# Patient Record
Sex: Male | Born: 1970 | Race: Black or African American | Hispanic: No | Marital: Married | Smoking: Former smoker
Health system: Southern US, Community
[De-identification: ages and names within clinical notes are randomized; demographics above are authoritative.]

## PROBLEM LIST (undated history)

## (undated) DIAGNOSIS — E669 Obesity, unspecified: Secondary | ICD-10-CM

## (undated) DIAGNOSIS — J40 Bronchitis, not specified as acute or chronic: Secondary | ICD-10-CM

## (undated) DIAGNOSIS — E785 Hyperlipidemia, unspecified: Secondary | ICD-10-CM

## (undated) DIAGNOSIS — R079 Chest pain, unspecified: Secondary | ICD-10-CM

## (undated) DIAGNOSIS — J189 Pneumonia, unspecified organism: Secondary | ICD-10-CM

## (undated) DIAGNOSIS — U071 COVID-19: Secondary | ICD-10-CM

## (undated) DIAGNOSIS — R0602 Shortness of breath: Secondary | ICD-10-CM

## (undated) DIAGNOSIS — J9690 Respiratory failure, unspecified, unspecified whether with hypoxia or hypercapnia: Secondary | ICD-10-CM

## (undated) HISTORY — DX: COVID-19: U07.1

## (undated) HISTORY — DX: Hyperlipidemia, unspecified: E78.5

## (undated) HISTORY — PX: SKIN GRAFT: SHX250

## (undated) HISTORY — DX: Chest pain, unspecified: R07.9

## (undated) HISTORY — PX: APPENDECTOMY: SHX54

## (undated) HISTORY — DX: Shortness of breath: R06.02

## (undated) HISTORY — DX: Pneumonia, unspecified organism: J18.9

## (undated) HISTORY — DX: Respiratory failure, unspecified, unspecified whether with hypoxia or hypercapnia: J96.90

## (undated) HISTORY — DX: Obesity, unspecified: E66.9

---

## 2005-05-17 ENCOUNTER — Emergency Department: Payer: Self-pay | Admitting: Emergency Medicine

## 2005-05-17 ENCOUNTER — Other Ambulatory Visit: Payer: Self-pay

## 2005-07-09 ENCOUNTER — Emergency Department: Payer: Self-pay | Admitting: Emergency Medicine

## 2005-09-27 ENCOUNTER — Emergency Department: Payer: Self-pay | Admitting: Unknown Physician Specialty

## 2006-09-29 ENCOUNTER — Emergency Department: Payer: Self-pay | Admitting: Emergency Medicine

## 2006-09-29 ENCOUNTER — Other Ambulatory Visit: Payer: Self-pay

## 2011-02-12 ENCOUNTER — Emergency Department: Payer: Self-pay | Admitting: Emergency Medicine

## 2011-06-06 ENCOUNTER — Emergency Department: Payer: Self-pay | Admitting: Emergency Medicine

## 2012-11-27 ENCOUNTER — Emergency Department: Payer: Self-pay | Admitting: Emergency Medicine

## 2012-11-27 LAB — COMPREHENSIVE METABOLIC PANEL
Albumin: 4.2 g/dL (ref 3.4–5.0)
Anion Gap: 2 — ABNORMAL LOW (ref 7–16)
BUN: 11 mg/dL (ref 7–18)
Bilirubin,Total: 0.6 mg/dL (ref 0.2–1.0)
Calcium, Total: 8.8 mg/dL (ref 8.5–10.1)
Chloride: 107 mmol/L (ref 98–107)
EGFR (Non-African Amer.): >60
Glucose: 114 mg/dL — ABNORMAL HIGH (ref 65–99)
SGPT (ALT): 51 U/L (ref 12–78)
Sodium: 139 mmol/L (ref 136–145)
Total Protein: 8 g/dL (ref 6.4–8.2)

## 2012-11-27 LAB — CBC
HCT: 47.4 % (ref 40.0–52.0)
HGB: 15.7 g/dL (ref 13.0–18.0)
MCH: 29.7 pg (ref 26.0–34.0)
MCV: 90 fL (ref 80–100)
Platelet: 212 10*3/uL (ref 150–440)
RBC: 5.29 10*6/uL (ref 4.40–5.90)
RDW: 14.1 % (ref 11.5–14.5)
WBC: 7.7 10*3/uL (ref 3.8–10.6)

## 2012-11-27 LAB — CK TOTAL AND CKMB (NOT AT ARMC): CK-MB: <0.5 ng/mL — ABNORMAL LOW (ref 0.5–3.6)

## 2012-11-27 LAB — TROPONIN I: Troponin-I: <0.02 ng/mL

## 2013-11-15 ENCOUNTER — Ambulatory Visit: Payer: Self-pay | Admitting: Family Medicine

## 2013-11-15 LAB — DOT URINE DIP
Blood: NEGATIVE
Glucose,UR: NEGATIVE mg/dL (ref 0–75)
Protein: NEGATIVE
Specific Gravity: 1.01 (ref 1.003–1.030)

## 2013-11-16 ENCOUNTER — Ambulatory Visit: Payer: Self-pay

## 2016-05-20 ENCOUNTER — Ambulatory Visit (INDEPENDENT_AMBULATORY_CARE_PROVIDER_SITE_OTHER): Payer: BLUE CROSS/BLUE SHIELD

## 2016-05-20 ENCOUNTER — Encounter (HOSPITAL_COMMUNITY): Payer: Self-pay | Admitting: *Deleted

## 2016-05-20 ENCOUNTER — Ambulatory Visit (HOSPITAL_COMMUNITY)
Admission: EM | Admit: 2016-05-20 | Discharge: 2016-05-20 | Disposition: A | Discharge status: Home / Self Care | Payer: BLUE CROSS/BLUE SHIELD | Attending: Family Medicine | Admitting: Family Medicine

## 2016-05-20 DIAGNOSIS — M722 Plantar fascial fibromatosis: Secondary | ICD-10-CM

## 2016-05-20 MED ORDER — IBUPROFEN 600 MG PO TABS: 600.0000 mg | ORAL_TABLET | Freq: Four times a day (QID) | ORAL | 0 refills | Status: DC | PRN

## 2016-05-20 NOTE — ED Triage Notes (Signed)
Pt  Reports  Ongoing  Pain l  Heel  For   Quite  A  While  Getting  Worse  Recently      Pt  denys  Any  specefic injury  But is  On his  Feet a  Lot

## 2016-05-20 NOTE — ED Provider Notes (Signed)
[facility]    CSN: [id number] Arrival date & time: 05/20/16  1717  First Provider Contact:  First MD Initiated Contact with Patient 05/20/16 1749    History   Chief Complaint Chief Complaint  Patient presents with  . Foot Pain   HPI Casey [name] is a 45 y.o. male presenting for left foot pain.   He reports several months of vague, mild, pain in the rear of his left foot worse with long days standing/walking at work. This has acutely worsened 3 days ago without injury or trauma to the area, at the end of the work day causing severe pain on weight bearing, no pain when non-weight bearing. Gold bond ointment provides some relief of the pain at night, no medications tried. It is mostly on the heel but radiates up his ankle to the knee. It occurs at night and with first step in the morning. He denies h/o surgery to the foot or ankle, but has tried inserts for shoes due to previous pain and reports worse pain with that. No numbness or tingling.   History reviewed. No pertinent past medical history.  There are no active problems to display for this patient.  History reviewed. No pertinent surgical history.  Home Medications    Prior to Admission medications   Medication Sig Start Date End Date Taking? Authorizing Provider  ibuprofen (ADVIL,MOTRIN) 600 MG tablet Take 1 tablet (600 mg total) by mouth every 6 (six) hours as needed. 05/20/16   Tyrone Nineyan B Jeannette Maddy, MD   Family History History reviewed. No pertinent family history.  Social History Social History  Substance Use Topics  . Smoking status: Never Smoker  . Smokeless tobacco: Not on file  . Alcohol use No   Allergies   Review of patient's allergies indicates no known allergies.  Review of Systems Review of Systems As above  Physical Exam Triage Vital Signs ED Triage Vitals  Enc Vitals Group     BP 05/20/16 1743 132/78     Pulse Rate 05/20/16 1743 80     Resp 05/20/16 1743 18     Temp 05/20/16 1743 98.6 F  (37 C)     Temp Source 05/20/16 1743 Oral     SpO2 05/20/16 1743 100 %     Weight --      Height --      Head Circumference --      Peak Flow --      Pain Score 05/20/16 1745 9     Pain Loc --      Pain Edu? --      Excl. in GC? --    No data found.   Updated Vital Signs BP 132/78 (BP Location: Right Arm)   Pulse 80   Temp 98.6 F (37 C) (Oral)   Resp 18   SpO2 100%    Physical Exam  Constitutional: He appears well-developed and well-nourished. No distress.  Musculoskeletal:  Left foot with pes planus, no other deformities, effusions, ecchymoses. Significant tenderness to palpation of medial and lateral calcaneus, + pain with squeezing calcaneus. Single heel raise ok. No haglund's deformity or tenderness of achilles' tendon. Sensation intact to light touch proximally and distally, Full ROM, DP 2+ bilaterally, cap refill <2 sec, no proximal tib-fib tenderness.  Neurological:  antalgic gait not bearing weight on left rear foot  Vitals reviewed.  UC Treatments / Results  Labs (all labs ordered are listed, but only abnormal results are displayed) Labs Reviewed - No data  to display  EKG  EKG Interpretation None      Radiology Dg Os Calcis Left  Result Date: 05/20/2016 CLINICAL DATA:  Acute onset of left plantar heel pain. Initial encounter. EXAM: LEFT OS CALCIS - 2+ VIEW COMPARISON:  None. FINDINGS: There is no evidence of fracture or dislocation. The calcaneus appears intact. Small plantar and posterior calcaneal spurs are seen. No definite soft tissue abnormalities are characterized on radiograph. No ankle joint effusion is seen. Visualized joint spaces are preserved. IMPRESSION: No evidence of fracture or dislocation. Small plantar and posterior calcaneal spurs noted. Electronically Signed   By: Roanna RaiderJeffery  Chang M.D.   On: 05/20/2016 18:44   Procedures Procedures (including critical care time)  Medications Ordered in UC Medications - No data to display  Initial  Impression / Assessment and Plan / UC Course  I have reviewed the triage vital signs and the nursing notes.  Pertinent labs & imaging results that were available during my care of the patient were reviewed by me and considered in my medical decision making (see chart for details).  Final Clinical Impressions(s) / UC Diagnoses   Final diagnoses:  Plantar fasciitis of left foot   45 y.o. male with probable severe plantar fasciitis vs. early calcaneal stress fracture. Initial XR's negative. Will advise RICE, offloading as able, and NSAIDs prn. He will follow up with foot & ankle tomorrow per preexisting appointment. Pt declines footwear/support at this time. Other symptomatic management discussed.   New Prescriptions New Prescriptions   IBUPROFEN (ADVIL,MOTRIN) 600 MG TABLET    Take 1 tablet (600 mg total) by mouth every 6 (six) hours as needed.     Tyrone Nineyan B Darenda Fike, MD 05/20/16 [phone]

## 2017-10-13 ENCOUNTER — Other Ambulatory Visit: Payer: Self-pay | Admitting: Internal Medicine

## 2017-10-13 ENCOUNTER — Ambulatory Visit
Admission: RE | Admit: 2017-10-13 | Discharge: 2017-10-13 | Disposition: A | Discharge status: Home / Self Care | Payer: BLUE CROSS/BLUE SHIELD | Source: Ambulatory Visit | Attending: Internal Medicine | Admitting: Internal Medicine

## 2017-10-13 DIAGNOSIS — M542 Cervicalgia: Secondary | ICD-10-CM

## 2017-10-13 DIAGNOSIS — M25512 Pain in left shoulder: Secondary | ICD-10-CM

## 2018-02-18 ENCOUNTER — Emergency Department (HOSPITAL_COMMUNITY)
Admission: EM | Admit: 2018-02-18 | Discharge: 2018-02-19 | Disposition: A | Discharge status: Home / Self Care | Payer: Self-pay | Attending: Emergency Medicine | Admitting: Emergency Medicine

## 2018-02-18 ENCOUNTER — Emergency Department (HOSPITAL_COMMUNITY): Payer: Self-pay

## 2018-02-18 ENCOUNTER — Encounter (HOSPITAL_COMMUNITY): Payer: Self-pay | Admitting: Emergency Medicine

## 2018-02-18 DIAGNOSIS — Z5321 Procedure and treatment not carried out due to patient leaving prior to being seen by health care provider: Secondary | ICD-10-CM | POA: Insufficient documentation

## 2018-02-18 DIAGNOSIS — R079 Chest pain, unspecified: Secondary | ICD-10-CM | POA: Insufficient documentation

## 2018-02-18 HISTORY — DX: Bronchitis, not specified as acute or chronic: J40

## 2018-02-18 LAB — CBC
HCT: 46.9 % (ref 39.0–52.0)
Hemoglobin: 15.5 g/dL (ref 13.0–17.0)
MCH: 29.9 pg (ref 26.0–34.0)
MCHC: 33 g/dL (ref 30.0–36.0)
MCV: 90.4 fL (ref 78.0–100.0)
PLATELETS: 223 10*3/uL (ref 150–400)
RBC: 5.19 MIL/uL (ref 4.22–5.81)
RDW: 12.5 % (ref 11.5–15.5)
WBC: 6.7 10*3/uL (ref 4.0–10.5)

## 2018-02-18 LAB — I-STAT TROPONIN, ED: TROPONIN I, POC: 0 ng/mL (ref 0.00–0.08)

## 2018-02-18 NOTE — ED Triage Notes (Signed)
Reports having cold like symptoms yesterday morning.  Then noticed chest pressure and sob earlier today.  No cardiac history.

## 2018-02-19 LAB — BASIC METABOLIC PANEL WITH GFR
Anion gap: 4 — ABNORMAL LOW (ref 5–15)
BUN: 7 mg/dL (ref 6–20)
CO2: 30 mmol/L (ref 22–32)
Calcium: 9.3 mg/dL (ref 8.9–10.3)
Chloride: 105 mmol/L (ref 101–111)
Creatinine, Ser: 1.29 mg/dL — ABNORMAL HIGH (ref 0.61–1.24)
GFR calc Af Amer: >60 mL/min
GFR calc non Af Amer: >60 mL/min
Glucose, Bld: 110 mg/dL — ABNORMAL HIGH (ref 65–99)
Potassium: 4 mmol/L (ref 3.5–5.1)
Sodium: 139 mmol/L (ref 135–145)

## 2020-05-26 ENCOUNTER — Emergency Department (HOSPITAL_COMMUNITY): Payer: Managed Care, Other (non HMO)

## 2020-05-26 ENCOUNTER — Emergency Department (HOSPITAL_COMMUNITY)
Admission: EM | Admit: 2020-05-26 | Discharge: 2020-05-26 | Disposition: A | Discharge status: Home / Self Care | Payer: Managed Care, Other (non HMO) | Attending: Emergency Medicine | Admitting: Emergency Medicine

## 2020-05-26 ENCOUNTER — Encounter (HOSPITAL_COMMUNITY): Payer: Self-pay | Admitting: Emergency Medicine

## 2020-05-26 ENCOUNTER — Other Ambulatory Visit: Payer: Self-pay

## 2020-05-26 DIAGNOSIS — U071 COVID-19: Secondary | ICD-10-CM | POA: Insufficient documentation

## 2020-05-26 DIAGNOSIS — R5383 Other fatigue: Secondary | ICD-10-CM | POA: Diagnosis not present

## 2020-05-26 DIAGNOSIS — J1282 Pneumonia due to coronavirus disease 2019: Secondary | ICD-10-CM | POA: Diagnosis not present

## 2020-05-26 DIAGNOSIS — R0789 Other chest pain: Secondary | ICD-10-CM | POA: Diagnosis present

## 2020-05-26 DIAGNOSIS — Z87891 Personal history of nicotine dependence: Secondary | ICD-10-CM | POA: Insufficient documentation

## 2020-05-26 DIAGNOSIS — R55 Syncope and collapse: Secondary | ICD-10-CM | POA: Diagnosis not present

## 2020-05-26 LAB — COMPREHENSIVE METABOLIC PANEL
ALT: 29 U/L (ref 0–44)
AST: 27 U/L (ref 15–41)
Albumin: 4 g/dL (ref 3.5–5.0)
Alkaline Phosphatase: 68 U/L (ref 38–126)
Anion gap: 9 (ref 5–15)
BUN: 12 mg/dL (ref 6–20)
CO2: 24 mmol/L (ref 22–32)
Calcium: 8.7 mg/dL — ABNORMAL LOW (ref 8.9–10.3)
Chloride: 99 mmol/L (ref 98–111)
Creatinine, Ser: 1.01 mg/dL (ref 0.61–1.24)
GFR calc Af Amer: >60 mL/min (ref >60)
GFR calc non Af Amer: >60 mL/min (ref >60)
Glucose, Bld: 129 mg/dL — ABNORMAL HIGH (ref 70–99)
Potassium: 4.1 mmol/L (ref 3.5–5.1)
Sodium: 132 mmol/L — ABNORMAL LOW (ref 135–145)
Total Bilirubin: 0.9 mg/dL (ref 0.3–1.2)
Total Protein: 7.7 g/dL (ref 6.5–8.1)

## 2020-05-26 LAB — CBC WITH DIFFERENTIAL/PLATELET
Abs Immature Granulocytes: 0.02 10*3/uL (ref 0.00–0.07)
Basophils Absolute: 0 10*3/uL (ref 0.0–0.1)
Basophils Relative: 0 %
Eosinophils Absolute: 0 10*3/uL (ref 0.0–0.5)
Eosinophils Relative: 0 %
HCT: 45.4 % (ref 39.0–52.0)
Hemoglobin: 16 g/dL (ref 13.0–17.0)
Immature Granulocytes: 0 %
Lymphocytes Relative: 13 %
Lymphs Abs: 1 10*3/uL (ref 0.7–4.0)
MCH: 31.2 pg (ref 26.0–34.0)
MCHC: 35.2 g/dL (ref 30.0–36.0)
MCV: 88.5 fL (ref 80.0–100.0)
Monocytes Absolute: 0.8 10*3/uL (ref 0.1–1.0)
Monocytes Relative: 10 %
Neutro Abs: 6 10*3/uL (ref 1.7–7.7)
Neutrophils Relative %: 77 %
Platelets: 190 10*3/uL (ref 150–400)
RBC: 5.13 MIL/uL (ref 4.22–5.81)
RDW: 12.3 % (ref 11.5–15.5)
WBC: 7.8 10*3/uL (ref 4.0–10.5)
nRBC: 0 % (ref 0.0–0.2)

## 2020-05-26 LAB — URINALYSIS, ROUTINE W REFLEX MICROSCOPIC
Bilirubin Urine: NEGATIVE
Glucose, UA: NEGATIVE mg/dL
Hgb urine dipstick: NEGATIVE
Ketones, ur: 5 mg/dL — AB
Leukocytes,Ua: NEGATIVE
Nitrite: NEGATIVE
Protein, ur: NEGATIVE mg/dL
Specific Gravity, Urine: 1.013 (ref 1.005–1.030)
pH: 6 (ref 5.0–8.0)

## 2020-05-26 LAB — TROPONIN I (HIGH SENSITIVITY)
Troponin I (High Sensitivity): 5 ng/L (ref <18)
Troponin I (High Sensitivity): 5 ng/L (ref <18)

## 2020-05-26 LAB — SARS CORONAVIRUS 2 BY RT PCR (HOSPITAL ORDER, PERFORMED IN ~~LOC~~ HOSPITAL LAB): SARS Coronavirus 2: POSITIVE — AB

## 2020-05-26 MED ORDER — SODIUM CHLORIDE 0.9 % IV SOLN: Freq: Once | INTRAVENOUS | Status: AC

## 2020-05-26 MED ORDER — DIPHENHYDRAMINE HCL 50 MG/ML IJ SOLN: 50.0000 mg | Freq: Once | INTRAMUSCULAR | Status: DC | PRN

## 2020-05-26 MED ORDER — ONDANSETRON 4 MG PO TBDP: ORAL_TABLET | ORAL | 0 refills | Status: DC

## 2020-05-26 MED ORDER — ONDANSETRON HCL 4 MG/2ML IJ SOLN
4.0000 mg | Freq: Once | INTRAMUSCULAR | Status: AC
  Administered 2020-05-26: 4 mg via INTRAVENOUS
  Filled 2020-05-26: qty 2

## 2020-05-26 MED ORDER — ALBUTEROL SULFATE HFA 108 (90 BASE) MCG/ACT IN AERS: 2.0000 | INHALATION_SPRAY | Freq: Once | RESPIRATORY_TRACT | Status: DC | PRN

## 2020-05-26 MED ORDER — SODIUM CHLORIDE 0.9 % IV SOLN
1200.0000 mg | Freq: Once | INTRAVENOUS | Status: AC
  Administered 2020-05-26: 1200 mg via INTRAVENOUS
  Filled 2020-05-26: qty 1200

## 2020-05-26 MED ORDER — SODIUM CHLORIDE 0.9 % IV SOLN: INTRAVENOUS | Status: DC | PRN

## 2020-05-26 MED ORDER — EPINEPHRINE 0.3 MG/0.3ML IJ SOAJ: 0.3000 mg | Freq: Once | INTRAMUSCULAR | Status: DC | PRN

## 2020-05-26 MED ORDER — METHYLPREDNISOLONE SODIUM SUCC 125 MG IJ SOLR: 125.0000 mg | Freq: Once | INTRAMUSCULAR | Status: DC | PRN

## 2020-05-26 MED ORDER — FAMOTIDINE IN NACL 20-0.9 MG/50ML-% IV SOLN: 20.0000 mg | Freq: Once | INTRAVENOUS | Status: DC | PRN

## 2020-05-26 MED ORDER — SODIUM CHLORIDE 0.9 % IV BOLUS
1000.0000 mL | Freq: Once | INTRAVENOUS | Status: AC
  Administered 2020-05-26: 1000 mL via INTRAVENOUS

## 2020-05-26 NOTE — Discharge Instructions (Signed)
You have tested positive for COVID-19 virus.  Please continue to quarantine at home and monitor your symptoms closely. You chest x-ray shows mild Covid pneumonia but her oxygen saturations remained normal when walking.  Antibiotics are not helpful in treating viral infection, the virus should run its course in about 14  days. Please make sure you are drinking plenty of fluids and eating regular meals you were dehydrated today and this is likely why you had been feeling lightheaded and having episodes where you passed out.  You can treat your symptoms supportively with tylenol and Motrin for fevers and pains, and over the counter cough syrups and throat lozenges to help with cough. Zofran as needed for nausea.  If your symptoms are not improving please follow up with you Primary doctor.   You were given Covid monoclonal antibody infusion today to hopefully reduce the chance of you having severe symptoms.  I recommend that you purchase a home pulse ox to help better monitor your oxygen at home, if you start to have increased work of breathing or shortness of breath or your oxygen drops below 90% please immediately return to the hospital for reevaluation.  If you develop persistent fevers, shortness of breath or difficulty breathing, chest pain, severe headache and neck pain, persistent nausea and vomiting or other new or concerning symptoms return to the Emergency department.

## 2020-05-26 NOTE — ED Provider Notes (Signed)
[facility]-EMERGENCY DEPT Provider Note   CSN: [id number] Arrival date & time: 05/26/20  [id number]     History Chief Complaint  Patient presents with  . Chest Pain    covid positive    Casey [name] is a 49 y.o. male.  Casey [name] is a 49 y.o. male with history of bronchitis, who tested positive for Covid on Friday after having a week of symptoms.  Patient symptoms started with a cough, despite symptoms he traveled by plane to [place] while in [place] he lost his sense of smell and found out that his daughter had tested positive for Covid.  He returned home on a plane and continued to have worsening symptoms.  He states persistent cough, and Monday and Tuesday started having some persistent chest pain and shortness of breath.  He reports fatigue, body aches, generalized weakness and poor appetite.  No vomiting or diarrhea, has had some mild nausea.  Denies any associated abdominal pain.  He states that yesterday morning when getting up he had a syncopal episode, he is not sure if he fell to the floor, EMS was called out to check him out but patient refused transport.  Overnight he was standing up from the bed and had a second syncopal episode where he fell backwards onto the bed, so his wife called EMS and insisted he go for evaluation.  He states that he has been monitoring his symptoms at home, has not taken any medications today for treatment and has not had anything to eat or drink.  He states that Thursday night he did have one episode when he checked his pulse ox at home and it was 87%.  He has not noted any low readings since then that he knows of.  Patient did not receive his Covid vaccine.        Past Medical History:  Diagnosis Date  . Bronchitis     There are no problems to display for this patient.   Past Surgical History:  Procedure Laterality Date  . APPENDECTOMY    . SKIN GRAFT         History reviewed. No pertinent family history.  Social  History   Tobacco Use  . Smoking status: Former Games developermoker  . Smokeless tobacco: Never Used  Substance Use Topics  . Alcohol use: No  . Drug use: Never    Home Medications Prior to Admission medications   Medication Sig Start Date End Date Taking? Authorizing Provider  azithromycin (ZITHROMAX) 250 MG tablet Take 250 mg by mouth taper from 4 doses each day to 1 dose and stop.   Yes [provider]  predniSONE (DELTASONE) 5 MG tablet Take 5 mg by mouth See admin instructions. Taper dose   Yes [provider]  zinc gluconate 50 MG tablet Take 50 mg by mouth daily.   Yes [provider]  ibuprofen (ADVIL,MOTRIN) 600 MG tablet Take 1 tablet (600 mg total) by mouth every 6 (six) hours as needed. 05/20/16   Tyrone NineGrunz, Ryan B, MD  ondansetron (ZOFRAN ODT) 4 MG disintegrating tablet 4mg  ODT q4 hours prn nausea/vomit 05/26/20   Dartha LodgeFord, Ludie Pavlik N, PA-C    Allergies    Patient has no known allergies.  Review of Systems   Review of Systems  Constitutional: Positive for chills and fatigue. Negative for fever.  HENT: Positive for congestion and rhinorrhea. Negative for sore throat.   Respiratory: Positive for cough and shortness of breath.   Cardiovascular: Positive for  chest pain.  Gastrointestinal: Positive for nausea. Negative for abdominal pain, diarrhea and vomiting.  Genitourinary: Negative for dysuria and frequency.  Musculoskeletal: Positive for myalgias. Negative for arthralgias.  Skin: Negative for color change and rash.  Neurological: Positive for syncope, weakness (Generalized) and light-headedness. Negative for dizziness, numbness and headaches.    Physical Exam Updated Vital Signs BP (!) 147/91   Pulse 84   Temp 99.9 F (37.7 C) (Oral)   Resp 16   SpO2 96%   Physical Exam Vitals and nursing note reviewed.  Constitutional:      General: He is not in acute distress.    Appearance: He is well-developed. He is not diaphoretic.  HENT:     Head: Normocephalic  and atraumatic.  Eyes:     General:        Right eye: No discharge.        Left eye: No discharge.     Pupils: Pupils are equal, round, and reactive to light.  Cardiovascular:     Rate and Rhythm: Normal rate and regular rhythm.     Pulses:          Radial pulses are 2+ on the right side and 2+ on the left side.       Dorsalis pedis pulses are 2+ on the right side and 2+ on the left side.     Heart sounds: Normal heart sounds. No murmur heard.  No friction rub. No gallop.   Pulmonary:     Effort: Pulmonary effort is normal. No respiratory distress.     Breath sounds: Normal breath sounds. No wheezing or rales.     Comments: Respirations equal and unlabored, patient able to speak in full sentences, lungs clear to auscultation bilaterally Chest:     Chest wall: No tenderness.  Abdominal:     General: Bowel sounds are normal. There is no distension.     Palpations: Abdomen is soft. There is no mass.     Tenderness: There is no abdominal tenderness. There is no guarding.     Comments: Abdomen soft, nondistended, nontender to palpation in all quadrants without guarding or peritoneal signs  Musculoskeletal:        General: No deformity.     Cervical back: Neck supple.     Right lower leg: No edema.     Left lower leg: No edema.  Skin:    General: Skin is warm and dry.     Capillary Refill: Capillary refill takes less than 2 seconds.  Neurological:     Mental Status: He is alert.     Coordination: Coordination normal.     Comments: Speech is clear, able to follow commands Moves extremities without ataxia, coordination intact  Psychiatric:        Mood and Affect: Mood normal.        Behavior: Behavior normal.     ED Results / Procedures / Treatments   Labs (all labs ordered are listed, but only abnormal results are displayed) Labs Reviewed  SARS CORONAVIRUS 2 BY RT PCR (HOSPITAL ORDER, PERFORMED IN [facility]) - Abnormal; Notable for the following components:       Result Value   SARS Coronavirus 2 POSITIVE (*)    All other components within normal limits  COMPREHENSIVE METABOLIC PANEL - Abnormal; Notable for the following components:   Sodium 132 (*)    Glucose, Bld 129 (*)    Calcium 8.7 (*)    All other components within normal  limits  URINALYSIS, ROUTINE W REFLEX MICROSCOPIC - Abnormal; Notable for the following components:   Ketones, ur 5 (*)    All other components within normal limits  CBC WITH DIFFERENTIAL/PLATELET  TROPONIN I (HIGH SENSITIVITY)  TROPONIN I (HIGH SENSITIVITY)    EKG EKG Interpretation  Date/Time:  Sunday May 26 2020 06:46:13 EDT Ventricular Rate:  85 PR Interval:    QRS Duration: 82 QT Interval:  338 QTC Calculation: 402 R Axis:   73 Text Interpretation: Sinus rhythm Left atrial enlargement ST elevation suggests acute pericarditis Confirmed by Kohut, Stephen ([id number]) on 05/26/2020 7:29:55 AM   Radiology DG Chest Portable 1 View  Result Date: 05/26/2020 CLINICAL DATA:  COVID. EXAM: PORTABLE CHEST 1 VIEW COMPARISON:  02/18/2018 FINDINGS: Mild patchy pulmonary opacity best seen at the left base. No Kerley lines, effusion, or pneumothorax. Normal heart size. IMPRESSION: Mild bilateral pneumonia. Electronically Signed   By: Jonathon  Watts M.D.   On: 05/26/2020 07:33    Procedures Procedures (including critical care time)  Medications Ordered in ED Medications  0.9 %  sodium chloride infusion (has no administration in time range)  diphenhydrAMINE (BENADRYL) injection 50 mg (has no administration in time range)  famotidine (PEPCID) IVPB 20 mg premix (has no administration in time range)  methylPREDNISolone sodium succinate (SOLU-MEDROL) 125 mg/2 mL injection 125 mg (has no administration in time range)  albuterol (VENTOLIN HFA) 108 (90 Base) MCG/ACT inhaler 2 puff (has no administration in time range)  EPINEPHrine (EPI-PEN) injection 0.3 mg (has no administration in time range)  sodium chloride 0.9 % bolus  1,000 mL (0 mLs Intravenous Stopped 05/26/20 1042)  ondansetron (ZOFRAN) injection 4 mg (4 mg Intravenous Given 05/26/20 0837)  casirivimab-imdevimab (REGEN-COV) 1,200 mg in sodium chloride 0.9 % 110 mL IVPB (0 mg Intravenous Stopped 05/26/20 1154)  0.9 %  sodium chloride infusion ( Intravenous Stopped 05/26/20 1338)    ED Course  I have reviewed the triage vital signs and the nursing notes.  Pertinent labs & imaging results that were available during my care of the patient were reviewed by me and considered in my medical decision making (see chart for details).    MDM Rules/Calculators/A&P                           49  year old male presents after being diagnosed with Covid on 8/20 after having a week of symptoms including cough, fatigue, chest pain and shortness of breath.  He has had very poor appetite and nausea and has not had much to eat or drink over the past several days, had a syncopal episode yesterday morning, EMS checked him out but he refused transport, had another syncopal episode during the night and his wife encouraged him to come in for evaluation.  Patient does not think that he had head injury during syncope and has no evidence of head trauma on exam.  Normal neurologic exam.  He currently denies any chest pain, reports shortness of breath with activity.  Patient ambulated in the emergency department and did not have hypoxia, tachycardia or significant increased work of breathing.  After receiving IV fluids here in the emergency department you no longer feels lightheaded or near syncopal symptoms.  His lab evaluation has overall been reassuring with no leukocytosis, no significant electrolyte derangements, normal renal and liver function.  Negative troponins x2, EKG without arrhythmia or concerning ischemic changes.  I have low suspicion for PE as aside from current Covid infection he  has no other risk factors, and is not tachycardic or hypoxic at rest or with ambulation.  Chest x-ray  consistent with Covid pneumonia.  Given patient's obesity and history of previous smoking he is a candidate for monoclonal antibody infusion, after discussing this with patient he is in agreement and would like to receive this here in the ED today, discussed potential risks and side effects of this medication and patient provides consent.  Patient was provided FDA handout with patient information on this new medication.  Patient received monoclonal antibody infusion and was monitored in the emergency department for 1 hour afterwards with no adverse reaction.  Patient is feeling much better after IV fluids and he has been able to ambulate stably in the emergency department with no further syncopal symptoms.  I have discussed continued symptomatic treatment at home and strict return precautions with the patient encouraged him to purchase home pulse ox to help monitor her oxygen levels and worsening symptoms at home.  Prescribe Zofran for nausea and encouraged to eat and drink meals regularly and to make sure he stays hydrated to prevent further syncopal episodes.   At this time there does not appear to be any evidence of an acute emergency medical condition and the patient appears stable for discharge with appropriate outpatient follow up.Diagnosis was discussed with patient who verbalizes understanding and is agreeable to discharge.   Final Clinical Impression(s) / ED Diagnoses Final diagnoses:  Pneumonia due to COVID-19 virus  Syncope, unspecified syncope type  Fatigue, unspecified type    Rx / DC Orders ED Discharge Orders         Ordered    ondansetron (ZOFRAN ODT) 4 MG disintegrating tablet        05/26/20 1327           Legrand Rams 05/28/20 2236    Raeford Razor, MD 05/29/20 [phone]

## 2020-05-26 NOTE — ED Triage Notes (Signed)
Patient brought in by [facility] from home. Patient tested positive for covid. Saturday had a syncope episode and ems checked him out. Patient refused transport. Patient had a feeling that he was going to pass out but did not. Patient is complaining of chest pain, cough, and back pain.

## 2020-05-26 NOTE — ED Notes (Signed)
CRITICAL VALUE ALERT  Critical Value:  COVID Positive   Date & Time Notied:  05/26/2020, 9417  Provider Notified: Ala Dach PA   Orders Received/Actions taken: PA aware

## 2020-06-11 ENCOUNTER — Ambulatory Visit (INDEPENDENT_AMBULATORY_CARE_PROVIDER_SITE_OTHER): Payer: Managed Care, Other (non HMO) | Admitting: Nurse Practitioner

## 2020-06-11 ENCOUNTER — Encounter: Payer: Self-pay | Admitting: Nurse Practitioner

## 2020-06-11 ENCOUNTER — Telehealth: Payer: Self-pay | Admitting: Nurse Practitioner

## 2020-06-11 ENCOUNTER — Other Ambulatory Visit: Payer: Self-pay

## 2020-06-11 ENCOUNTER — Ambulatory Visit
Admission: RE | Admit: 2020-06-11 | Discharge: 2020-06-11 | Disposition: A | Discharge status: Home / Self Care | Payer: Managed Care, Other (non HMO) | Source: Ambulatory Visit | Attending: Nurse Practitioner | Admitting: Nurse Practitioner

## 2020-06-11 VITALS — BP 122/74 | HR 73 | Temp 97.7°F | Ht 71.0 in | Wt 208.0 lb

## 2020-06-11 DIAGNOSIS — J1282 Pneumonia due to coronavirus disease 2019: Secondary | ICD-10-CM

## 2020-06-11 DIAGNOSIS — R079 Chest pain, unspecified: Secondary | ICD-10-CM | POA: Insufficient documentation

## 2020-06-11 DIAGNOSIS — R9431 Abnormal electrocardiogram [ECG] [EKG]: Secondary | ICD-10-CM | POA: Diagnosis not present

## 2020-06-11 DIAGNOSIS — R0602 Shortness of breath: Secondary | ICD-10-CM | POA: Insufficient documentation

## 2020-06-11 DIAGNOSIS — U071 COVID-19: Secondary | ICD-10-CM | POA: Insufficient documentation

## 2020-06-11 DIAGNOSIS — R39198 Other difficulties with micturition: Secondary | ICD-10-CM

## 2020-06-11 DIAGNOSIS — Z8616 Personal history of COVID-19: Secondary | ICD-10-CM | POA: Insufficient documentation

## 2020-06-11 MED ORDER — ALBUTEROL SULFATE HFA 108 (90 BASE) MCG/ACT IN AERS: 2.0000 | INHALATION_SPRAY | Freq: Four times a day (QID) | RESPIRATORY_TRACT | 0 refills | Status: DC | PRN

## 2020-06-11 MED ORDER — BENZONATATE 100 MG PO CAPS: 100.0000 mg | ORAL_CAPSULE | Freq: Two times a day (BID) | ORAL | 0 refills | Status: DC | PRN

## 2020-06-11 NOTE — Assessment & Plan Note (Signed)
Shortness of breath:  Will check repeat chest x ray   Stay active - increase activity as tolerated  Stay well hydrated  Deep breathing exercises  May start vitamin C 2,000 mg daily, vitamin D3 2,000 IU daily, Zinc 220 mg daily, and Quercetin 500 mg twice daily  May take tylenol or fever or pain  May take mucinex DM twice daily    Difficulty urinating:  Will place referral to urology - this was happening before Covid   Hand out given for PCP - please call to make an appointment with a PCP - keep BP log for PCP appt   Abnormal EKG:  EKG in office shows NSR with possible acute pericarditis - will place referral to cardiology   Follow up:  Follow up in 2 weeks or sooner if needed

## 2020-06-11 NOTE — Patient Instructions (Addendum)
Covid pneumonia Shortness of breath:  Will check repeat chest x ray   Stay active - increase activity as tolerated  Stay well hydrated  Deep breathing exercises  May start vitamin C 2,000 mg daily, vitamin D3 2,000 IU daily, Zinc 220 mg daily, and Quercetin 500 mg twice daily  May take tylenol or fever or pain  May take mucinex DM twice daily    Difficulty urinating:  Will place referral to urology - this was happening before Covid   Hand out given for PCP - please call to make an appointment with a PCP - keep BP log for PCP appt   Abnormal EKG:  EKG in office shows NSR with possible acute pericarditis - will place referral to cardiology   Follow up:  Follow up in 2 weeks or sooner if needed

## 2020-06-11 NOTE — Progress Notes (Signed)
@Patient  ID: , male    DOB: [birth year], 49 y.o.   MRN: [id number]  Chief Complaint  Patient presents with  . Covid Positive    Tested positive 8/22; Sx: Fatigue, cough    Referring provider: 9/22, MD  49 year old male with no significant health history. Diagnosed with Covid August 2021.   HPI  Patient presents today for post COVID care clinic visit/ED follow-up.  Patient was seen in the ED on 05/26/2020 and was diagnosed with Covid pneumonia.  Patient was treated with monoclonal antibody infusion while in the ED.  Patient is EKG in the emergency room showed sinus rhythm with left atrial enlargement and ST elevation suggesting acute pericarditis.  Negative troponins x2.  Patient states that he has slightly improved since ED discharge.  He still complains of shortness of breath on exertion especially when climbing stairs.  Patient still complains of ongoing cough.  He states that he has been using his incentive spirometer at home.  He does still complain of fatigue.  Patient states that he does not currently have a PCP.  We will give him a handout today on local [facility] PCPs here excepting new patients.  Patient does also complain of difficulty urinating and is requesting a referral to urology.  He states that this was an issue before he had Covid.  Vital signs in office today are stable.  Repeat EKG does still show possible pericarditis.  Cardiology appointment made for this Thursday and patient was made aware of his appointment time before leaving the office today. Denies f/c/s, n/v/d, hemoptysis, PND, chest pain or edema.     No Known Allergies   There is no immunization history on file for this patient.  Past Medical History:  Diagnosis Date  . Bronchitis     Tobacco History: Social History   Tobacco Use  Smoking Status Former Smoker  Smokeless Tobacco Never Used   Counseling given: Yes   Outpatient Encounter Medications as of 06/11/2020  Medication Sig   . ibuprofen (ADVIL,MOTRIN) 600 MG tablet Take 1 tablet (600 mg total) by mouth every 6 (six) hours as needed.  . zinc gluconate 50 MG tablet Take 50 mg by mouth daily.  08/11/2020 albuterol (VENTOLIN HFA) 108 (90 Base) MCG/ACT inhaler Inhale 2 puffs into the lungs every 6 (six) hours as needed for wheezing or shortness of breath.  . benzonatate (TESSALON) 100 MG capsule Take 1 capsule (100 mg total) by mouth 2 (two) times daily as needed for cough.  . ondansetron (ZOFRAN ODT) 4 MG disintegrating tablet 4mg  ODT q4 hours prn nausea/vomit (Patient not taking: Reported on 06/11/2020)  . [DISCONTINUED] azithromycin (ZITHROMAX) 250 MG tablet Take 250 mg by mouth taper from 4 doses each day to 1 dose and stop.  . [DISCONTINUED] predniSONE (DELTASONE) 5 MG tablet Take 5 mg by mouth See admin instructions. Taper dose   No facility-administered encounter medications on file as of 06/11/2020.     Review of Systems  Review of Systems  Constitutional: Positive for fatigue. Negative for chills and fever.  Respiratory: Positive for cough and shortness of breath.   Cardiovascular: Negative for chest pain, palpitations and leg swelling.       Physical Exam  BP 122/74 (BP Location: Right Arm)   Pulse 73   Temp 97.7 F (36.5 C)   Ht 5\' 11"  (1.803 m)   Wt 208 lb (94.3 kg)   SpO2 97%   BMI 29.01 kg/m   Wt Readings  from Last 5 Encounters:  06/11/20 208 lb (94.3 kg)  05/26/20 205 lb (93 kg)  02/18/18 215 lb (97.5 kg)     Physical Exam Vitals and nursing note reviewed.  Constitutional:      General: He is not in acute distress.    Appearance: He is well-developed.  Cardiovascular:     Rate and Rhythm: Normal rate and regular rhythm.  Pulmonary:     Effort: Pulmonary effort is normal.     Breath sounds: Normal breath sounds.  Musculoskeletal:     Right lower leg: No edema.     Left lower leg: No edema.  Skin:    General: Skin is warm and dry.  Neurological:     Mental Status: He is alert and  oriented to person, place, and time.       Imaging: DG Chest Portable 1 View  Result Date: 05/26/2020 CLINICAL DATA:  COVID. EXAM: PORTABLE CHEST 1 VIEW COMPARISON:  02/18/2018 FINDINGS: Mild patchy pulmonary opacity best seen at the left base. No Kerley lines, effusion, or pneumothorax. Normal heart size. IMPRESSION: Mild bilateral pneumonia. Electronically Signed   By: Marnee Spring M.D.   On: 05/26/2020 07:33     Assessment & Plan:   History of COVID-19 Shortness of breath:  Will check repeat chest x ray   Stay active - increase activity as tolerated  Stay well hydrated  Deep breathing exercises  May start vitamin C 2,000 mg daily, vitamin D3 2,000 IU daily, Zinc 220 mg daily, and Quercetin 500 mg twice daily  May take tylenol or fever or pain  May take mucinex DM twice daily    Difficulty urinating:  Will place referral to urology - this was happening before Covid   Hand out given for PCP - please call to make an appointment with a PCP - keep BP log for PCP appt   Abnormal EKG:  EKG in office shows NSR with possible acute pericarditis - will place referral to cardiology   Follow up:  Follow up in 2 weeks or sooner if needed     Ivonne Andrew, NP 06/11/2020

## 2020-06-11 NOTE — Telephone Encounter (Signed)
Results given to patient. Verbally understood. No additional questions °

## 2020-06-11 NOTE — Telephone Encounter (Signed)
-----   Message from Ivonne Andrew, NP sent at 06/11/2020 11:44 AM EDT ----- Please call to let patient know that his x ray is back and did show improvement in inspiratory effort, but the pneumonia is still there. Sometimes it does take a few weeks for this to clear. Make sure to do breathing exercises and exercises that I gave him in the printout. He will need a 2-3 week follow up and will need a repeat chest x ray at that time. Thanks.

## 2020-06-13 ENCOUNTER — Encounter: Payer: Self-pay | Admitting: *Deleted

## 2020-06-13 ENCOUNTER — Ambulatory Visit (INDEPENDENT_AMBULATORY_CARE_PROVIDER_SITE_OTHER): Payer: Managed Care, Other (non HMO) | Admitting: Cardiology

## 2020-06-13 ENCOUNTER — Encounter: Payer: Self-pay | Admitting: Cardiology

## 2020-06-13 VITALS — BP 100/76 | HR 93 | Ht 71.0 in | Wt 209.8 lb

## 2020-06-13 DIAGNOSIS — R9431 Abnormal electrocardiogram [ECG] [EKG]: Secondary | ICD-10-CM | POA: Diagnosis not present

## 2020-06-13 DIAGNOSIS — Z8616 Personal history of COVID-19: Secondary | ICD-10-CM

## 2020-06-13 DIAGNOSIS — R0602 Shortness of breath: Secondary | ICD-10-CM | POA: Diagnosis not present

## 2020-06-13 NOTE — Progress Notes (Signed)
Cardiology Office Note:    Date:  06/13/2020   ID:  Casey [name], DOB [birth year], MRN [id number]  PCP:  Georgann Housekeeper, MD  [facility] Cardiologist:  Donato Schultz, MD  [facility] Electrophysiologist:  None   Referring MD: Ivonne Andrew, NP    History of Present Illness:    Casey [name] is a 49 y.o. male here for the evaluation of shortness of breath and abnormal EKG at the request of Angus Seller, NP.  Recent Covid pneumonia.  Was in the emergency department on 05/26/2020.  Diagnosed with Covid.  Treated with monoclonal antibody infusion while in the emergency department.  EKG showed sinus rhythm with left atrial enlargement and ST elevation suggestive of acute pericarditis.  His troponins were normal x2.  Shortness of breath with climbing stairs.  Cough.  He is here today to evaluate overall cardiac function.  Repeat EKG during an office visit on 06/11/2020 personally reviewed and interpreted shows diffuse J-point elevation especially in leads V4, V5, V6.  No real change from prior EKG on 05/26/2020.  In addition, I have personally reviewed his prior EKG from 2019-it is very similar with J-point elevation as well.  Same findings from 2007 as well.  When coughing had some discomfort. Eating [name]. 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  Had to postpone this because of Covid.  Just got appetite back. Lost weight. Not as winded. Still not feeling 100%. Used to walk over 2 miles a day.  Was struggling to get to the mailbox and back.  Slowly getting better.  Syncope episode when COVID   Used to have high BP, no longer.   Father CABG at later age.   No smoker.   Past Medical History:  Diagnosis Date  . Bronchitis   . Chest pain on exertion   . COVID-19   . Hyperlipidemia   . Obesity   . Pneumonia   . Respiratory failure (HCC)   . SOB (shortness of breath)     Past Surgical History:   Procedure Laterality Date  . APPENDECTOMY    . SKIN GRAFT      Current Medications: Current Meds  Medication Sig  . albuterol (VENTOLIN HFA) 108 (90 Base) MCG/ACT inhaler Inhale 2 puffs into the lungs every 6 (six) hours as needed for wheezing or shortness of breath.  . benzonatate (TESSALON) 100 MG capsule Take 1 capsule (100 mg total) by mouth 2 (two) times daily as needed for cough.  Marland Kitchen ibuprofen (ADVIL,MOTRIN) 600 MG tablet Take 1 tablet (600 mg total) by mouth every 6 (six) hours as needed.  . ondansetron (ZOFRAN ODT) 4 MG disintegrating tablet 4mg  ODT q4 hours prn nausea/vomit  . zinc gluconate 50 MG tablet Take 50 mg by mouth daily.     Allergies:   Other and Zegerid [omeprazole]   Social History   Socioeconomic History  . Marital status: Married    Spouse name: Not on file  . Number of children: Not on file  . Years of education: Not on file  . Highest education level: Not on file  Occupational History  . Not on file  Tobacco Use  . Smoking status: Former  . Smokeless tobacco: Never Used  Substance and Sexual Activity  . Alcohol use: No  . Drug use: Never  . Sexual activity: Not on file  Other Topics Concern  . Not on file  Social History  Narrative  . Not on file   Social Determinants of Health   Financial Resource Strain:   . Difficulty of Paying Living Expenses: Not on file  Food Insecurity:   . Worried About Programme researcher, broadcasting/film/video in the Last Year: Not on file  . Ran Out of Food in the Last Year: Not on file  Transportation Needs:   . Lack of Transportation (Medical): Not on file  . Lack of Transportation (Non-Medical): Not on file  Physical Activity:   . Days of Exercise per Week: Not on file  . Minutes of Exercise per Session: Not on file  Stress:   . Feeling of Stress : Not on file  Social Connections:   . Frequency of Communication with Friends and Family: Not on file  . Frequency of Social Gatherings with Friends and Family: Not on file  .  Attends Religious Services: Not on file  . Active Member of Clubs or Organizations: Not on file  . Attends Banker Meetings: Not on file  . Marital Status: Not on file     Family History: Father had CABG coronary disease.   ROS:   Please see the history of present illness.     All other systems reviewed and are negative.  EKGs/Labs/Other Studies Reviewed:    The following studies were reviewed today: Chest x-ray personally reviewed from both 8/22 and 06/11/2020-patchy infiltrates, Covid pneumonia.   Recent Labs: 05/26/2020: ALT 29; BUN 12; Creatinine, Ser 1.01; Hemoglobin 16.0; Platelets 190; Potassium 4.1; Sodium 132  Recent Lipid Panel No results found for: CHOL, TRIG, HDL, CHOLHDL, VLDL, LDLCALC, LDLDIRECT  Physical Exam:    VS:  BP 100/76   Pulse 93   Ht 5\' 11"  (1.803 m)   Wt 209 lb 12.8 oz (95.2 kg)   SpO2 96%   BMI 29.26 kg/m     Wt Readings from Last 3 Encounters:  06/13/20 209 lb 12.8 oz (95.2 kg)  06/11/20 208 lb (94.3 kg)  05/26/20 205 lb (93 kg)     GEN:  Well nourished, well developed in no acute distress HEENT: Normal NECK: No JVD; No carotid bruits LYMPHATICS: No lymphadenopathy CARDIAC: RRR, no murmurs, rubs, gallops RESPIRATORY:  Clear to auscultation without rales, wheezing or rhonchi  ABDOMEN: Soft, non-tender, non-distended MUSCULOSKELETAL:  No edema; No deformity  SKIN: Warm and dry NEUROLOGIC:  Alert and oriented x 3 PSYCHIATRIC:  Normal affect   ASSESSMENT:    1. Shortness of breath   2. History of COVID-19   3. Nonspecific abnormal electrocardiogram (ECG) (EKG)    PLAN:    In order of problems listed above:  Abnormal EKG -We will check an echocardiogram to ensure proper structure and function of his heart.  His EKG findings of J-point elevation have been present from prior EKGs even over 10 to 15 years ago.  This is not indicative of acute pericarditis in the setting.  Hopefully, there is no suggestion of post COVID  cardiomyopathy.  Echocardiogram will tell 05/28/20 this.  Dyspnea on exertion -Post Covid.  Still coughing.  Likely residual effects of Covid pneumonia.  Chest x-rays personally reviewed and interpreted demonstrate patchy infiltrates.  Prior ER notes reviewed. -Hopefully over time, this will improve.  He was quite sick with Covid, lost his appetite for 2 weeks.  Lost weight.  Checking echocardiogram.  Continuing with incentive spirometer.  We will follow-up with results of echocardiogram.   Medication Adjustments/Labs and Tests Ordered: Current medicines are reviewed at length with  the patient today.  Concerns regarding medicines are outlined above.  Orders Placed This Encounter  Procedures  . ECHOCARDIOGRAM COMPLETE   No orders of the defined types were placed in this encounter.   Patient Instructions  Medication Instructions:  The current medical regimen is effective;  continue present plan and medications.  *If you need a refill on your cardiac medications before your next appointment, please call your pharmacy*  Testing/Procedures: Your physician has requested that you have an echocardiogram. Echocardiography is a painless test that uses sound waves to create images of your heart. It provides your doctor with information about the size and shape of your heart and how well your heart's chambers and valves are working. This procedure takes approximately one hour. There are no restrictions for this procedure.  Follow-Up: Follow up will be determined after the above testing has been completed.  We recommend signing up for the patient portal called "MyChart".  Sign up information is provided on this After Visit Summary.  MyChart is used to connect with patients for Virtual Visits (Telemedicine).  Patients are able to view lab/test results, encounter notes, upcoming appointments, etc.  Non-urgent messages can be sent to your provider as well.   To learn more about what you can do with MyChart,  go to [email or web address].     Thank you for choosing [facility]!!        Signed, Donato Schultz, MD  06/13/2020 3:20 PM    [facility]

## 2020-06-13 NOTE — Patient Instructions (Signed)
Medication Instructions:  The current medical regimen is effective;  continue present plan and medications.  *If you need a refill on your cardiac medications before your next appointment, please call your pharmacy*  Testing/Procedures: Your physician has requested that you have an echocardiogram. Echocardiography is a painless test that uses sound waves to create images of your heart. It provides your doctor with information about the size and shape of your heart and how well your hearts chambers and valves are working. This procedure takes approximately one hour. There are no restrictions for this procedure.  Follow-Up: Follow up will be determined after the above testing has been completed.  We recommend signing up for the patient portal called "MyChart".  Sign up information is provided on this After Visit Summary.  MyChart is used to connect with patients for Virtual Visits (Telemedicine).  Patients are able to view lab/test results, encounter notes, upcoming appointments, etc.  Non-urgent messages can be sent to your provider as well.   To learn more about what you can do with MyChart, go to [email or web address].     Thank you for choosing [facility]!!

## 2020-06-14 ENCOUNTER — Encounter: Payer: Self-pay | Admitting: Nurse Practitioner

## 2020-06-27 ENCOUNTER — Other Ambulatory Visit: Payer: Self-pay

## 2020-06-27 ENCOUNTER — Ambulatory Visit (INDEPENDENT_AMBULATORY_CARE_PROVIDER_SITE_OTHER): Payer: Managed Care, Other (non HMO) | Admitting: Nurse Practitioner

## 2020-06-27 ENCOUNTER — Ambulatory Visit
Admission: RE | Admit: 2020-06-27 | Discharge: 2020-06-27 | Disposition: A | Discharge status: Home / Self Care | Payer: Managed Care, Other (non HMO) | Source: Ambulatory Visit | Attending: Nurse Practitioner | Admitting: Nurse Practitioner

## 2020-06-27 VITALS — BP 118/78 | HR 71 | Temp 97.5°F | Ht 71.0 in | Wt 209.0 lb

## 2020-06-27 DIAGNOSIS — R5381 Other malaise: Secondary | ICD-10-CM | POA: Diagnosis not present

## 2020-06-27 DIAGNOSIS — Z8616 Personal history of COVID-19: Secondary | ICD-10-CM | POA: Diagnosis not present

## 2020-06-27 DIAGNOSIS — G933 Postviral fatigue syndrome: Secondary | ICD-10-CM | POA: Diagnosis not present

## 2020-06-27 DIAGNOSIS — G9331 Postviral fatigue syndrome: Secondary | ICD-10-CM

## 2020-06-27 NOTE — Assessment & Plan Note (Signed)
Covid pnuemonia Shortness of breath Physical deconditioing:  Will check repeat chest x ray   Stay active - increase activity as tolerated  Stay well hydrated  Deep breathing exercises  May start vitamin C 2,000 mg daily, vitamin D3 2,000 IU daily, Zinc 220 mg daily, and Quercetin 500 mg twice daily  May take tylenol or fever or pain  May take mucinex DM twice daily    Difficulty urinating:  Please keep appointment with urology - this was happening before Covid    Abnormal EKG:  Please keep follow up with cardiology   Follow up:  Follow up in 1 month - may need repeat chest xray

## 2020-06-27 NOTE — Assessment & Plan Note (Deleted)
Shortness of breath:  Will check repeat chest x ray   Stay active - increase activity as tolerated  Stay well hydrated  Deep breathing exercises  May start vitamin C 2,000 mg daily, vitamin D3 2,000 IU daily, Zinc 220 mg daily, and Quercetin 500 mg twice daily  May take tylenol or fever or pain  May take mucinex DM twice daily    Difficulty urinating:  Will place referral to urology - this was happening before Covid   Hand out given for PCP - please call to make an appointment with a PCP - keep BP log for PCP appt   Abnormal EKG:  EKG in office shows NSR with possible acute pericarditis - will place referral to cardiology   Follow up:  Follow up in 2 weeks or sooner if needed 

## 2020-06-27 NOTE — Patient Instructions (Signed)
History of Covid Covid pnuemonia Shortness of breath Physical deconditioing:  Will check repeat chest x ray   Stay active - increase activity as tolerated  Stay well hydrated  Deep breathing exercises  May start vitamin C 2,000 mg daily, vitamin D3 2,000 IU daily, Zinc 220 mg daily, and Quercetin 500 mg twice daily  May take tylenol or fever or pain  May take mucinex DM twice daily    Difficulty urinating:  Please keep appointment with urology - this was happening before Covid    Abnormal EKG:  Please keep follow up with cardiology   Follow up:  Follow up in 1 month - may need repeat chest xray

## 2020-06-27 NOTE — Progress Notes (Signed)
@Patient  ID: , male    DOB: 03-24-1971, 49 y.o.   MRN: [id number]  Chief Complaint  Patient presents with  . Follow-up    2 week follow up, still taking musinex, still having SOB and tightness. Has already seen Cardiology schdeuled for 9/27 for ECHO    Referring provider: 10/27, MD  49 year old male with no significant health history. Diagnosed with Covid August 2021.    HPI  Patient presents for post COVID care clinic visit follow-up.  Since patient's last visit here he has seen cardiology and has an echo scheduled for Monday.  He was referred to urology for difficulty urinating which was happening before Covid and had does have an appointment scheduled with them in a couple weeks.  Patient complains of continued fatigue and deconditioning.  He states that he struggles performing activities of daily living.  He becomes fatigued very easily.  Patient's last chest x-ray showed improved airflow but persistent pneumonia.  Denies f/c/s, n/v/d, hemoptysis, PND, chest pain or edema.       Allergies  Allergen Reactions  . Other     KAPIDEX STOMACH PAIN  . Zegerid [Omeprazole]     BURNING IN THE CHEST     There is no immunization history on file for this patient.  Past Medical History:  Diagnosis Date  . Bronchitis   . Chest pain on exertion   . COVID-19   . Hyperlipidemia   . Obesity   . Pneumonia   . Respiratory failure (HCC)   . SOB (shortness of breath)     Tobacco History: Social History   Tobacco Use  Smoking Status Former Smoker  Smokeless Tobacco Never Used   Counseling given: Not Answered   Outpatient Encounter Medications as of 06/27/2020  Medication Sig  . albuterol (VENTOLIN HFA) 108 (90 Base) MCG/ACT inhaler Inhale 2 puffs into the lungs every 6 (six) hours as needed for wheezing or shortness of breath.  . zinc gluconate 50 MG tablet Take 50 mg by mouth daily.  . benzonatate (TESSALON) 100 MG capsule Take 1 capsule (100 mg total)  by mouth 2 (two) times daily as needed for cough. (Patient not taking: Reported on 06/27/2020)  . ibuprofen (ADVIL,MOTRIN) 600 MG tablet Take 1 tablet (600 mg total) by mouth every 6 (six) hours as needed. (Patient not taking: Reported on 06/27/2020)  . ondansetron (ZOFRAN ODT) 4 MG disintegrating tablet 4mg  ODT q4 hours prn nausea/vomit (Patient not taking: Reported on 06/27/2020)   No facility-administered encounter medications on file as of 06/27/2020.     Review of Systems  Review of Systems  Constitutional: Positive for fatigue. Negative for fever.  HENT: Negative.   Respiratory: Positive for cough and shortness of breath.   Cardiovascular: Negative.   Gastrointestinal: Negative.   Allergic/Immunologic: Negative.   Neurological: Negative.   Psychiatric/Behavioral: Negative.        Physical Exam  BP 118/78 (BP Location: Right Arm)   Pulse 71   Temp (!) 97.5 F (36.4 C)   Ht 5\' 11"  (1.803 m)   Wt 209 lb 0.1 oz (94.8 kg)   SpO2 98%   BMI 29.15 kg/m   Wt Readings from Last 5 Encounters:  06/27/20 209 lb 0.1 oz (94.8 kg)  06/13/20 209 lb 12.8 oz (95.2 kg)  06/11/20 208 lb (94.3 kg)  05/26/20 205 lb (93 kg)  02/18/18 215 lb (97.5 kg)     Physical Exam Vitals and nursing note reviewed.  Constitutional:  General: He is not in acute distress.    Appearance: He is well-developed.  Cardiovascular:     Rate and Rhythm: Normal rate and regular rhythm.  Pulmonary:     Effort: Pulmonary effort is normal.     Breath sounds: Normal breath sounds.  Musculoskeletal:     Right lower leg: No edema.     Left lower leg: No edema.  Skin:    General: Skin is warm and dry.  Neurological:     Mental Status: He is alert and oriented to person, place, and time.  Psychiatric:        Mood and Affect: Mood normal.        Behavior: Behavior normal.     Imaging: DG Chest 2 View  Result Date: 06/11/2020 CLINICAL DATA:  History of COVID, history of smoking . EXAM: CHEST - 2 VIEW  COMPARISON:  Chest x-ray 05/26/2020, chest x-ray 02/18/2018 FINDINGS: The heart size and mediastinal contours are within normal limits. Improved inspiratory effort. Persistent mild patchy peripheral airspace opacities within the mid to lower lung zones, left greater than right. Bilateral lower lobe round pericentimeter densities likely represent nipple shadows. The visualized skeletal structures are unremarkable. IMPRESSION: Persistent mild bilateral pneumonia. Electronically Signed   By: Tish Frederickson M.D.   On: 06/11/2020 09:46     Assessment & Plan:   History of COVID-19 Covid pnuemonia Shortness of breath Physical deconditioing:  Will check repeat chest x ray   Stay active - increase activity as tolerated  Stay well hydrated  Deep breathing exercises  May start vitamin C 2,000 mg daily, vitamin D3 2,000 IU daily, Zinc 220 mg daily, and Quercetin 500 mg twice daily  May take tylenol or fever or pain  May take mucinex DM twice daily    Difficulty urinating:  Please keep appointment with urology - this was happening before Covid    Abnormal EKG:  Please keep follow up with cardiology   Follow up:  Follow up in 1 month - may need repeat chest xray      Ivonne Andrew, NP 06/27/2020

## 2020-07-01 ENCOUNTER — Ambulatory Visit (HOSPITAL_COMMUNITY): Payer: Managed Care, Other (non HMO) | Attending: Internal Medicine

## 2020-07-01 ENCOUNTER — Other Ambulatory Visit: Payer: Self-pay

## 2020-07-01 DIAGNOSIS — R0602 Shortness of breath: Secondary | ICD-10-CM | POA: Insufficient documentation

## 2020-07-01 LAB — ECHOCARDIOGRAM COMPLETE
Area-P 1/2: 3.65 cm2
S' Lateral: 3.2 cm

## 2020-07-03 ENCOUNTER — Encounter: Payer: Self-pay | Admitting: Nurse Practitioner

## 2020-07-18 ENCOUNTER — Other Ambulatory Visit: Payer: Self-pay

## 2020-07-18 ENCOUNTER — Ambulatory Visit: Payer: Managed Care, Other (non HMO) | Attending: Nurse Practitioner

## 2020-07-18 DIAGNOSIS — M6281 Muscle weakness (generalized): Secondary | ICD-10-CM | POA: Diagnosis present

## 2020-07-18 DIAGNOSIS — R262 Difficulty in walking, not elsewhere classified: Secondary | ICD-10-CM | POA: Diagnosis present

## 2020-07-18 DIAGNOSIS — R5382 Chronic fatigue, unspecified: Secondary | ICD-10-CM | POA: Insufficient documentation

## 2020-07-18 DIAGNOSIS — U099 Post covid-19 condition, unspecified: Secondary | ICD-10-CM | POA: Insufficient documentation

## 2020-07-18 DIAGNOSIS — G9332 Myalgic encephalomyelitis/chronic fatigue syndrome: Secondary | ICD-10-CM

## 2020-07-18 DIAGNOSIS — Z7409 Other reduced mobility: Secondary | ICD-10-CM | POA: Diagnosis present

## 2020-07-19 NOTE — Therapy (Signed)
[facility] [address] [place], [place], [address] Phone: [phone]   Fax:  [phone]  Physical Therapy Evaluation  Patient Details  Name: Casey [name] MRN: [id number] Date of Birth: 1971-03-15 Referring Provider (PT): Ivonne Andrew, NP   Encounter Date: 07/18/2020   PT End of Session - 07/18/20 1817    Visit Number 1    Number of Visits 9    Date for PT Re-Evaluation 09/14/20    Authorization Type [employer] MA[employer]GED    PT Start Time 1500    PT Stop Time 1600    PT Time Calculation (min) 60 min    Activity Tolerance Patient tolerated treatment well    Behavior During Therapy [facility] for tasks assessed/performed           Past Medical History:  Diagnosis Date  . Bronchitis   . Chest pain on exertion   . COVID-19   . Hyperlipidemia   . Obesity   . Pneumonia   . Respiratory failure (HCC)   . SOB (shortness of breath)     Past Surgical History:  Procedure Laterality Date  . APPENDECTOMY    . SKIN GRAFT      Vitals:      Subjective Assessment - 07/18/20 1744    Subjective Pt reports being Dx c covid-19 on 05/24/20. Since then he reports he is better, but still has a long way to go. Pt is to return to work at [employer] 10/18 and is not sure how he is going to do with the fatigue and SOB issues he is experiencing.    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 No/denies              [facility] PT Assessment - 07/19/20 0001      Assessment   Medical Diagnosis History of COVID-19; Physical deconditioning; Postviral fatigue syndrome    Referring Provider (PT) Ivonne Andrew, NP    Onset Date/Surgical Date 05/24/20    Hand Dominance Right    Prior Therapy No      Precautions   Precautions None      Restrictions   Weight Bearing Restrictions No      Balance Screen   Has the patient  fallen in the past 6 months No    Has the patient had a decrease in activity level because of a fear of falling?  No    Is the patient reluctant to leave their home because of a fear of falling?  No      Home Environment   Living Environment Private residence    Living Arrangements Spouse/significant other    Type of Home House    Home Access Stairs to enter    Entrance Stairs-Number of Steps 3    Entrance Stairs-Rails Left;Right    Home Layout Two level    Alternate Level Stairs-Number of Steps 20    Alternate Level Stairs-Rails Right;Left      Prior Function   Vocation Full time employment    Vocation Requirements [employer]      Cognition   Overall Cognitive Status Within Functional Limits for tasks assessed      Observation/Other Assessments   Focus on Therapeutic Outcomes (FOTO)  52% functional ability      Sensation   Light Touch Appears Intact  Posture/Postural Control   Posture/Postural Control No significant limitations      AROM   Overall AROM Comments AROMs for UEs and LEs WNLs      Strength   Overall Strength Comments UEs and LEs 5/5 strength except for bilat shoulder flex and abd      Ambulation/Gait   Gait Pattern Step-through pattern;Within Functional Limits      6 Minute Walk- Baseline   6 Minute Walk- Baseline yes    HR (bpm) 86    02 Sat (%RA) 98 %    Perceived Rate of Exertion (Borg) 6-   none     6 Minute walk- Post Test   6 Minute Walk Post Test yes    HR (bpm) 96    02 Sat (%RA) 94 %    Perceived Rate of Exertion (Borg) 11- Fairly light      6 minute walk test results    Aerobic Endurance Distance Walked 1015    Endurance additional comments .47 yds/sec                      Objective measurements completed on examination: See above findings.               PT Education - 07/18/20 1813    Education Details Eval findings, POC, walking program of 1-2x/day for 1 mile, managing distance and pace as tolerated,  not exceeding a Somewhat Hard RPE. Pt is to progress walking 15 mins every 3-4 dyas as tolerated.    Person(s) Educated Patient    Methods Explanation    Comprehension Verbalized understanding;Returned demonstration;Verbal cues required;Tactile cues required            PT Short Term Goals - 07/19/20 0656      PT SHORT TERM GOAL #1   Title Pt will be Ind in an initial HEP walking progream    Status New    Target Date 08/09/20      PT SHORT TERM GOAL #2   Title Review the results of FOTO results    Baseline FOTO administered    Status New    Target Date 08/02/20             PT Long Term Goals - 07/19/20 0657      PT LONG TERM GOAL #1   Title Pt will be Ind in an advanced HEP to continue the benefit of PT    Status New    Target Date 09/27/20      PT LONG TERM GOAL #2   Title Re-administer FOTO on 6th visit    Status New    Target Date 08/16/20      PT LONG TERM GOAL #3   Title With , pt's distance will increase to 2508 ft for a walking pace of 1.1 yd/sec for improve tolerance to activity at home and work    Baseline 1876 ft, .47 yds/sec    Status New    Target Date 09/27/20      PT LONG TERM GOAL #4   Title Pt will report toleraance to work activity to " limited a little bit "    Baseline "Limited a lot"    Status New    Target Date 09/27/20      PT LONG TERM GOAL #5   Title Pt's FOTO score will improve to 72% functioal ability    Baseline 52% functional ability    Status New    Target Date 09/27/20  Plan - 07/18/20 1819    Clinical Impression Statement Pt presents with decreased tolerance to activity and fatigue s/p covid-19 in August. Pt's distance was significantly limited with a walking speed of .47 yds/sec. Pt reported a Fairly light RPE. Pt will benefit from PT to improve strength and activity tolerance to improve his function with daily activities and to help him return to work at highest possible level.    Personal  Factors and Comorbidities Comorbidity 2    Comorbidities covid-19, bronchitis, obese    Examination-Activity Limitations Carry;Lift;Stairs;Squat;Locomotion Level;Reach Overhead;Transfers    Examination-Participation Restrictions Occupation    Stability/Clinical Decision Making Evolving/Moderate complexity    Clinical Decision Making Moderate    Rehab Potential Good    PT Frequency 1x / week    PT Duration 8 weeks    PT Treatment/Interventions ADLs/Self Care Home Management;Gait training;Stair training;Functional mobility training;Therapeutic activities;Therapeutic exercise;Balance training;Manual techniques;Patient/family education;Energy conservation;Taping    PT Next Visit Plan Assess response to walking program    PT Home Exercise Plan Walking progream- see education    Recommended Other Services Note written to employer to limit work requirements based on pt's tolerance    Consulted and Agree with Plan of Care Patient           Patient will benefit from skilled therapeutic intervention in order to improve the following deficits and impairments:  Cardiopulmonary status limiting activity, Decreased activity tolerance, Decreased strength, Obesity, Decreased endurance, Difficulty walking  Visit Diagnosis: Post-COVID chronic fatigue  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 Chest pain 06/11/2020  . Shortness of breath 06/11/2020  . History of COVID-19 06/11/2020  . Nonspecific abnormal electrocardiogram (ECG) (EKG) 06/11/2020  . Difficulty urinating 06/11/2020  . Pneumonia due to COVID-19 virus 06/11/2020    Joellyn Rued MS, PT 07/19/20 7:26 AM  [facility] [address] [place], [place], [address] Phone: [phone]   Fax:   [phone]  Name: BROC CASPERS MRN: [id number] Date of Birth: 01-Mar-1971

## 2020-07-24 ENCOUNTER — Ambulatory Visit: Payer: Managed Care, Other (non HMO) | Admitting: Urology

## 2020-07-25 ENCOUNTER — Ambulatory Visit (INDEPENDENT_AMBULATORY_CARE_PROVIDER_SITE_OTHER): Payer: Managed Care, Other (non HMO) | Admitting: Nurse Practitioner

## 2020-07-25 ENCOUNTER — Other Ambulatory Visit: Payer: Self-pay

## 2020-07-25 VITALS — BP 138/88 | HR 80 | Temp 97.8°F | Ht 71.0 in | Wt 215.0 lb

## 2020-07-25 DIAGNOSIS — R39198 Other difficulties with micturition: Secondary | ICD-10-CM

## 2020-07-25 DIAGNOSIS — Z8616 Personal history of COVID-19: Secondary | ICD-10-CM

## 2020-07-25 DIAGNOSIS — R0602 Shortness of breath: Secondary | ICD-10-CM | POA: Diagnosis not present

## 2020-07-25 MED ORDER — ALBUTEROL SULFATE HFA 108 (90 BASE) MCG/ACT IN AERS: 2.0000 | INHALATION_SPRAY | Freq: Four times a day (QID) | RESPIRATORY_TRACT | 0 refills | Status: AC | PRN

## 2020-07-25 NOTE — Assessment & Plan Note (Signed)
Shortness of breath Physical deconditioing:  Recent chest x ray was clear  Stay active - increase activity as tolerated  Stay well hydrated  Deep breathing exercises  May start vitamin C 2,000 mg daily, vitamin D3 2,000 IU daily, Zinc 220 mg daily, and Quercetin 500 mg twice daily  May take tylenol or fever or pain  May take mucinex DM twice daily    Difficulty urinating:  Please keep appointment with urology - this was happening before Covid    Abnormal EKG:  Please keep follow up with cardiology   Follow up:  Follow up as needed

## 2020-07-25 NOTE — Patient Instructions (Signed)
History of Covid Shortness of breath Physical deconditioing:  Recent chest x ray was clear  Stay active - increase activity as tolerated  Stay well hydrated  Deep breathing exercises  May start vitamin C 2,000 mg daily, vitamin D3 2,000 IU daily, Zinc 220 mg daily, and Quercetin 500 mg twice daily  May take tylenol or fever or pain  May take mucinex DM twice daily    Difficulty urinating:  Please keep appointment with urology - this was happening before Covid    Abnormal EKG:  Please keep follow up with cardiology   Follow up:  Follow up as needed

## 2020-07-25 NOTE — Progress Notes (Signed)
@Patient  ID: , male    DOB: [birth year], 49 y.o.   MRN: [id number]  Chief Complaint  Patient presents with  . Follow-up    No questions or concerns    Referring provider: [id number], MD   49 year old male with no significant health history. Diagnosed with Covid August 2021.  HPI  Patient presents today for post COVID care clinic visit follow-up.  Patient states that he has much improved since last visit.  Patient works 2 jobs he has started back to one of his jobs.  Patient states that he does use his albuterol inhaler as needed and needs a refill on this today.  Patient's chest x-ray at last visit was clear.  Patient has been evaluated by physical therapy and has started an exercise program with them.  Patient did follow-up with urology for difficulty urinating.  Patient also followed up with cardiology for abnormal EKG from the emergency room.  Cardiology ordered an echo and patient does have a follow-up appointment scheduled with them to discuss results.  Overall patient states that he is doing well. Denies f/c/s, n/v/d, hemoptysis, PND, chest pain or edema.       Allergies  Allergen Reactions  . Other     KAPIDEX STOMACH PAIN  . Zegerid [Omeprazole]     BURNING IN THE CHEST     There is no immunization history on file for this patient.  Past Medical History:  Diagnosis Date  . Bronchitis   . Chest pain on exertion   . COVID-19   . Hyperlipidemia   . Obesity   . Pneumonia   . Respiratory failure (HCC)   . SOB (shortness of breath)     Tobacco History: Social History   Tobacco Use  Smoking Status Former Smoker  Smokeless Tobacco Never Used   Counseling given: Not Answered   Outpatient Encounter Medications as of 07/25/2020  Medication Sig  . albuterol (VENTOLIN HFA) 108 (90 Base) MCG/ACT inhaler Inhale 2 puffs into the lungs every 6 (six) hours as needed for wheezing or shortness of breath.  . zinc gluconate 50 MG tablet Take 50 mg by  mouth daily.  . [DISCONTINUED] albuterol (VENTOLIN HFA) 108 (90 Base) MCG/ACT inhaler Inhale 2 puffs into the lungs every 6 (six) hours as needed for wheezing or shortness of breath.  . benzonatate (TESSALON) 100 MG capsule Take 1 capsule (100 mg total) by mouth 2 (two) times daily as needed for cough. (Patient not taking: Reported on 06/27/2020)  . ibuprofen (ADVIL,MOTRIN) 600 MG tablet Take 1 tablet (600 mg total) by mouth every 6 (six) hours as needed. (Patient not taking: Reported on 06/27/2020)  . ondansetron (ZOFRAN ODT) 4 MG disintegrating tablet 4mg  ODT q4 hours prn nausea/vomit (Patient not taking: Reported on 06/27/2020)   No facility-administered encounter medications on file as of 07/25/2020.     Review of Systems  Review of Systems  Constitutional: Negative.  Negative for fever.  HENT: Negative.   Respiratory: Negative for cough and shortness of breath.   Cardiovascular: Negative.  Negative for chest pain, palpitations and leg swelling.  Gastrointestinal: Negative.   Allergic/Immunologic: Negative.   Neurological: Negative.   Psychiatric/Behavioral: Negative.        Physical Exam  BP 138/88   Pulse 80   Temp 97.8 F (36.6 C)   Ht 5\' 11"  (1.803 m)   Wt 215 lb 0.1 oz (97.5 kg)   SpO2 98%   BMI 29.99 kg/m   Wt  Readings from Last 5 Encounters:  07/25/20 215 lb 0.1 oz (97.5 kg)  06/27/20 209 lb 0.1 oz (94.8 kg)  06/13/20 209 lb 12.8 oz (95.2 kg)  06/11/20 208 lb (94.3 kg)  05/26/20 205 lb (93 kg)     Physical Exam Vitals and nursing note reviewed.  Constitutional:      General: He is not in acute distress.    Appearance: He is well-developed.  Cardiovascular:     Rate and Rhythm: Normal rate and regular rhythm.  Pulmonary:     Effort: Pulmonary effort is normal.     Breath sounds: Normal breath sounds.  Musculoskeletal:     Right lower leg: No edema.     Left lower leg: No edema.  Skin:    General: Skin is warm and dry.  Neurological:     Mental  Status: He is alert and oriented to person, place, and time.  Psychiatric:        Mood and Affect: Mood normal.        Behavior: Behavior normal.      Imaging: DG Chest 2 View  Result Date: 06/28/2020 CLINICAL DATA:  Cough and shortness of breath. Reported COVID-19 positive EXAM: CHEST - 2 VIEW COMPARISON:  June 11, 2020 FINDINGS: Lungs are clear. Heart size and pulmonary vascularity are normal. No adenopathy. No bone lesions. IMPRESSION: Lungs clear.  Cardiac silhouette normal. Electronically Signed   By: Bretta Bang III M.D.   On: 06/28/2020 08:42   ECHOCARDIOGRAM COMPLETE  Result Date: 07/01/2020    ECHOCARDIOGRAM REPORT   Patient Name:   Casey [name] Date of Exam: 07/01/2020 Medical Rec #:  [id number]      Height:       71.0 in Accession #:    [id number]     Weight:       209.0 lb Date of Birth:  January 30, 1971      BSA:          2.148 m Patient Age:    49 years       BP:           118/78 mmHg Patient Gender: M              HR:           75 bpm. Exam Location:  [address] Procedure: 2D Echo, 3D Echo, Cardiac Doppler, Color Doppler and Strain Analysis Indications:    R06.02 Shortness of breath  History:        Patient has no prior history of Echocardiogram examinations.                 Signs/Symptoms:Shortness of Breath; Risk Factors:Former Smoker                 and Obesity. COVID-19 05/26/2020.  Sonographer:    Garald Braver, RDCS Referring Phys: [id number] SKAINS IMPRESSIONS  1. Global longitudinal strain is -22.5%. Left ventricular ejection fraction, by estimation, is 55 to 60%. The left ventricle has normal function. The left ventricle has no regional wall motion abnormalities. Left ventricular diastolic parameters were normal.  2. Right ventricular systolic function is normal. The right ventricular size is normal.  3. The mitral valve is abnormal. Trivial mitral valve regurgitation.  4. The aortic valve is abnormal. Aortic valve regurgitation is trivial. Mild aortic valve sclerosis  is present, with no evidence of aortic valve stenosis.  5. The inferior vena cava is normal in size with greater than 50% respiratory variability,  suggesting right atrial pressure of 3 mmHg. FINDINGS  Left Ventricle: Global longitudinal strain is -22.5%. Left ventricular ejection fraction, by estimation, is 55 to 60%. The left ventricle has normal function. The left ventricle has no regional wall motion abnormalities. The left ventricular internal cavity size was normal in size. There is no left ventricular hypertrophy. Left ventricular diastolic parameters were normal. Right Ventricle: The right ventricular size is normal. No increase in right ventricular wall thickness. Right ventricular systolic function is normal. Left Atrium: Left atrial size was normal in size. Right Atrium: Right atrial size was normal in size. Pericardium: There is no evidence of pericardial effusion. Mitral Valve: The mitral valve is abnormal. There is mild thickening of the mitral valve leaflet(s). Trivial mitral valve regurgitation. Tricuspid Valve: The tricuspid valve is normal in structure. Tricuspid valve regurgitation is trivial. Aortic Valve: The aortic valve is abnormal. Aortic valve regurgitation is trivial. Mild aortic valve sclerosis is present, with no evidence of aortic valve stenosis. Pulmonic Valve: The pulmonic valve was normal in structure. Pulmonic valve regurgitation is not visualized. Aorta: The aortic root is normal in size and structure. Venous: The inferior vena cava is normal in size with greater than 50% respiratory variability, suggesting right atrial pressure of 3 mmHg. IAS/Shunts: No atrial level shunt detected by color flow Doppler.  LEFT VENTRICLE PLAX 2D LVIDd:         4.50 cm  Diastology LVIDs:         3.20 cm  LV e' medial:    7.51 cm/s LV PW:         1.00 cm  LV E/e' medial:  11.0 LV IVS:        0.90 cm  LV e' lateral:   11.10 cm/s LVOT diam:     2.00 cm  LV E/e' lateral: 7.5 LV SV:         86 LV SV Index:    40       2D Longitudinal Strain LVOT Area:     3.14 cm 2D Strain GLS (A2C):   -23.9 %                         2D Strain GLS (A3C):   -20.0 %                         2D Strain GLS (A4C):   -23.5 %                         2D Strain GLS Avg:     -22.5 %                          3D Volume EF:                         3D EF:        58 %                         LV EDV:       164 ml                         LV ESV:       70 ml  LV SV:        95 ml RIGHT VENTRICLE RV Basal diam:  3.60 cm RV S prime:     16.00 cm/s TAPSE (M-mode): 2.7 cm LEFT ATRIUM             Index       RIGHT ATRIUM           Index LA diam:        3.20 cm 1.49 cm/m  RA Pressure: 3.00 mmHg LA Vol (A2C):   37.9 ml 17.64 ml/m RA Area:     9.49 cm LA Vol (A4C):   35.8 ml 16.66 ml/m RA Volume:   17.80 ml  8.29 ml/m LA Biplane Vol: 37.0 ml 17.22 ml/m  AORTIC VALVE LVOT Vmax:   148.00 cm/s LVOT Vmean:  90.700 cm/s LVOT VTI:    0.275 m  AORTA Ao Root diam: 3.40 cm Ao Asc diam:  2.80 cm MITRAL VALVE               TRICUSPID VALVE                            Estimated RAP:  3.00 mmHg  MV E velocity: 82.70 cm/s  SHUNTS MV A velocity: 90.40 cm/s  Systemic VTI:  0.28 m MV E/A ratio:  0.91        Systemic Diam: 2.00 cm Dietrich Pates MD Electronically signed by Dietrich Pates MD Signature Date/Time: 07/01/2020/7:35:24 PM    Final      Assessment & Plan:   History of COVID-19 Shortness of breath Physical deconditioing:  Recent chest x ray was clear  Stay active - increase activity as tolerated  Stay well hydrated  Deep breathing exercises  May start vitamin C 2,000 mg daily, vitamin D3 2,000 IU daily, Zinc 220 mg daily, and Quercetin 500 mg twice daily  May take tylenol or fever or pain  May take mucinex DM twice daily    Difficulty urinating:  Please keep appointment with urology - this was happening before Covid    Abnormal EKG:  Please keep follow up with cardiology   Follow up:  Follow up as  needed      Ivonne Andrew, NP 07/25/2020

## 2020-07-30 ENCOUNTER — Telehealth: Payer: Self-pay | Admitting: Internal Medicine

## 2020-07-30 NOTE — Telephone Encounter (Signed)
Pt is requesting a call back to further clarify xray results

## 2020-07-30 NOTE — Telephone Encounter (Signed)
ERROR

## 2020-08-06 ENCOUNTER — Telehealth: Payer: Self-pay | Admitting: Physical Therapy

## 2020-08-06 ENCOUNTER — Ambulatory Visit: Payer: Managed Care, Other (non HMO) | Attending: Nurse Practitioner | Admitting: Physical Therapy

## 2020-08-06 DIAGNOSIS — M6281 Muscle weakness (generalized): Secondary | ICD-10-CM | POA: Insufficient documentation

## 2020-08-06 DIAGNOSIS — R5382 Chronic fatigue, unspecified: Secondary | ICD-10-CM | POA: Insufficient documentation

## 2020-08-06 DIAGNOSIS — U099 Post covid-19 condition, unspecified: Secondary | ICD-10-CM | POA: Insufficient documentation

## 2020-08-06 DIAGNOSIS — R262 Difficulty in walking, not elsewhere classified: Secondary | ICD-10-CM | POA: Insufficient documentation

## 2020-08-06 DIAGNOSIS — Z7409 Other reduced mobility: Secondary | ICD-10-CM | POA: Insufficient documentation

## 2020-08-06 NOTE — Telephone Encounter (Signed)
Attempted to call pt regarding missed appointment today. No one answered and unable to leave voicemail due to voicemail being full.    Casey [name] PT, DPT, LAT, ATC  08/06/20  3:10 PM

## 2020-08-13 ENCOUNTER — Ambulatory Visit: Payer: Managed Care, Other (non HMO) | Admitting: Physical Therapy

## 2020-08-13 ENCOUNTER — Telehealth: Payer: Self-pay | Admitting: Physical Therapy

## 2020-08-13 NOTE — Telephone Encounter (Signed)
Attempted calling pt regarding 2nd missed appointment. No answer and unable to LVM due to mailbox being full.  Vicki Chaffin PT, DPT, LAT, ATC  08/13/20  2:39 PM

## 2020-08-20 ENCOUNTER — Other Ambulatory Visit: Payer: Self-pay

## 2020-08-20 ENCOUNTER — Ambulatory Visit: Payer: Managed Care, Other (non HMO)

## 2020-08-20 DIAGNOSIS — U099 Post covid-19 condition, unspecified: Secondary | ICD-10-CM

## 2020-08-20 DIAGNOSIS — G9332 Myalgic encephalomyelitis/chronic fatigue syndrome: Secondary | ICD-10-CM

## 2020-08-20 DIAGNOSIS — R262 Difficulty in walking, not elsewhere classified: Secondary | ICD-10-CM

## 2020-08-20 DIAGNOSIS — M6281 Muscle weakness (generalized): Secondary | ICD-10-CM | POA: Diagnosis present

## 2020-08-20 DIAGNOSIS — Z7409 Other reduced mobility: Secondary | ICD-10-CM | POA: Diagnosis present

## 2020-08-20 DIAGNOSIS — R5382 Chronic fatigue, unspecified: Secondary | ICD-10-CM | POA: Diagnosis present

## 2020-08-21 NOTE — Therapy (Addendum)
[facility] [place], [place], [address] Phone: [phone]   Fax:  [phone]  Physical Therapy Treatment/Discharge Summary  Patient Details  Name: Casey [name] MRN: [id number] Date of Birth: 01/09/1971 Referring Provider (PT): Fenton Foy, NP   Encounter Date: 08/20/2020   PT End of Session - 08/20/20 1515    Visit Number 2    Number of Visits 9    Date for PT Re-Evaluation 09/14/20    Authorization Type [employer] MANAGED    PT Start Time 2831    PT Stop Time 1532    PT Time Calculation (min) 40 min    Activity Tolerance Patient tolerated treatment well    Behavior During Therapy [facility] for tasks assessed/performed           Past Medical History:  Diagnosis Date  . Bronchitis   . Chest pain on exertion   . COVID-19   . Hyperlipidemia   . Obesity   . Pneumonia   . Respiratory failure ([place])   . SOB (shortness of breath)     Past Surgical History:  Procedure Laterality Date  . APPENDECTOMY    . SKIN GRAFT      There were no vitals filed for this visit.   Subjective Assessment - 08/20/20 1457    Subjective Pt reports he has returned to work on light duty 1/2 days. 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 Pt states he is fatigued after work.    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 No/denies                             [facility] Adult PT Treatment/Exercise - 08/21/20 0001      Exercises   Exercises Shoulder;Knee/Hip      Knee/Hip Exercises: Aerobic   Nustep 10 mins, L 5, arms and legs. Pre ex O2 sat 97%, HR 92, post ex O2 sat 96%, HR 93. pt reported a Fairly light RPE following NuStep.      Knee/Hip Exercises: Standing   Wall Squat 10 reps;2 sets      Shoulder Exercises: Standing   Extension Both;15 reps    Theraband Level (Shoulder Extension) Level 4 (Blue)     Extension Limitations 2 sets    Row Both;15 reps    Theraband Level (Shoulder Row) Level 4 (Blue)    Row Limitations 2 sets    Other Standing Exercises Chest press 15x2 c BTB    Other Standing Exercises Bicep curls 15x2, BTB                  PT Education - 08/21/20 0718    Education Details HEP for UE and LE strengthening and progressive walking program. Ed for RPE management and use of pursed lip breathing for recovery.    Person(s) Educated Patient    Methods Demonstration;Explanation;Tactile cues;Verbal cues;Handout    Comprehension Verbalized understanding;Returned demonstration;Verbal cues required;Tactile cues required;Need further instruction            PT Short Term Goals - 07/19/20 0656      PT SHORT TERM GOAL #1   Title Pt will be Ind in an initial HEP walking progream    Status New    Target Date 08/09/20      PT SHORT TERM GOAL #  2   Title Review the results of FOTO results    Baseline FOTO administered    Status New    Target Date 08/02/20             PT Long Term Goals - 07/19/20 0657      PT LONG TERM GOAL #1   Title Pt will be Ind in an advanced HEP to continue the benefit of PT    Status New    Target Date 09/27/20      PT LONG TERM GOAL #2   Title Re-administer FOTO on 6th visit    Status New    Target Date 08/16/20      PT LONG TERM GOAL #3   Title With 6MWT, pt's distance will increase to 2508 ft for a walking pace of 1.1 yd/sec for improve tolerance to activity at home and work    [place] ft, .47 yds/sec    Status New    Target Date 09/27/20      PT LONG TERM GOAL #4   Title Pt will report toleraance to work activity to " limited a little bit "    Baseline "Limited a lot"    Status New    Target Date 09/27/20      PT LONG TERM GOAL #5   Title Pt's FOTO score will improve to 72% functioal ability    Baseline 52% functional ability    Status New    Target Date 09/27/20                 Plan - 08/20/20 1518     Clinical Impression Statement PT was completed for an UE and LE strengthening program and for improving pt's activity tolerance. Additionally, Ed was provided for RPE management and use use of pursed lip breathing. Pt demonstrated good understandingof the exs and the education provided. pt tolerated the session well.    Personal Factors and Comorbidities Comorbidity 2    Comorbidities covid-19, bronchitis, obese    Examination-Activity Limitations Carry;Lift;Stairs;Squat;Locomotion Level;Reach Overhead;Transfers    Examination-Participation Restrictions Occupation    Stability/Clinical Decision Making Evolving/Moderate complexity    Clinical Decision Making Moderate    Rehab Potential Good    PT Frequency 1x / week    PT Duration 8 weeks    PT Treatment/Interventions ADLs/Self Care Home Management;Gait training;Stair training;Functional mobility training;Therapeutic activities;Therapeutic exercise;Balance training;Manual techniques;Patient/family education;Energy conservation;Taping    PT Next Visit Plan Assess response to HEP    PT Home Exercise Plan [id number]. 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 Chest pain 06/11/2020  . Shortness of breath 06/11/2020  . History of COVID-19 06/11/2020  . Nonspecific abnormal electrocardiogram (ECG) (EKG) 06/11/2020  . Difficulty urinating 06/11/2020  .  Pneumonia due to COVID-19 virus 06/11/2020   Gar Ponto MS, PT 08/21/20 7:39 AM   PHYSICAL THERAPY DISCHARGE SUMMARY  Visits from Start of Care: 2  Current functional level related to goals / functional outcomes:  See above   Remaining deficits: See above   Education / Equipment: HEP Plan: Patient agrees to discharge.  Patient goals were not met. Patient is being discharged due to not returning since the last visit.  ?????        Pt participated in 2 PT sessions.   [facility] [place], [place], [address] Phone: [phone]   Fax:  [phone]  Name: Casey [name] MRN: [id number] Date of Birth: 1971-08-01

## 2020-08-26 ENCOUNTER — Ambulatory Visit: Payer: Managed Care, Other (non HMO)

## 2020-09-12 ENCOUNTER — Ambulatory Visit: Payer: Managed Care, Other (non HMO)

## 2020-11-19 ENCOUNTER — Encounter: Payer: Self-pay | Admitting: Nurse Practitioner

## 2021-08-11 ENCOUNTER — Other Ambulatory Visit: Payer: Self-pay | Admitting: Internal Medicine

## 2021-08-11 ENCOUNTER — Ambulatory Visit
Admission: RE | Admit: 2021-08-11 | Discharge: 2021-08-11 | Disposition: A | Discharge status: Home / Self Care | Payer: BC Managed Care – PPO | Source: Ambulatory Visit | Attending: Internal Medicine | Admitting: Internal Medicine

## 2021-08-11 DIAGNOSIS — M25562 Pain in left knee: Secondary | ICD-10-CM

## 2021-08-11 DIAGNOSIS — M545 Low back pain, unspecified: Secondary | ICD-10-CM

## 2021-08-11 DIAGNOSIS — M25561 Pain in right knee: Secondary | ICD-10-CM

## 2021-11-30 IMAGING — CR DG KNEE 1-2V*R*
2 series · 2 of 2 positions shown · non-contrast
Comparison: None.

CLINICAL DATA: Right knee pain

EXAM:
RIGHT KNEE - 1-2 VIEW

[w knee ap right]
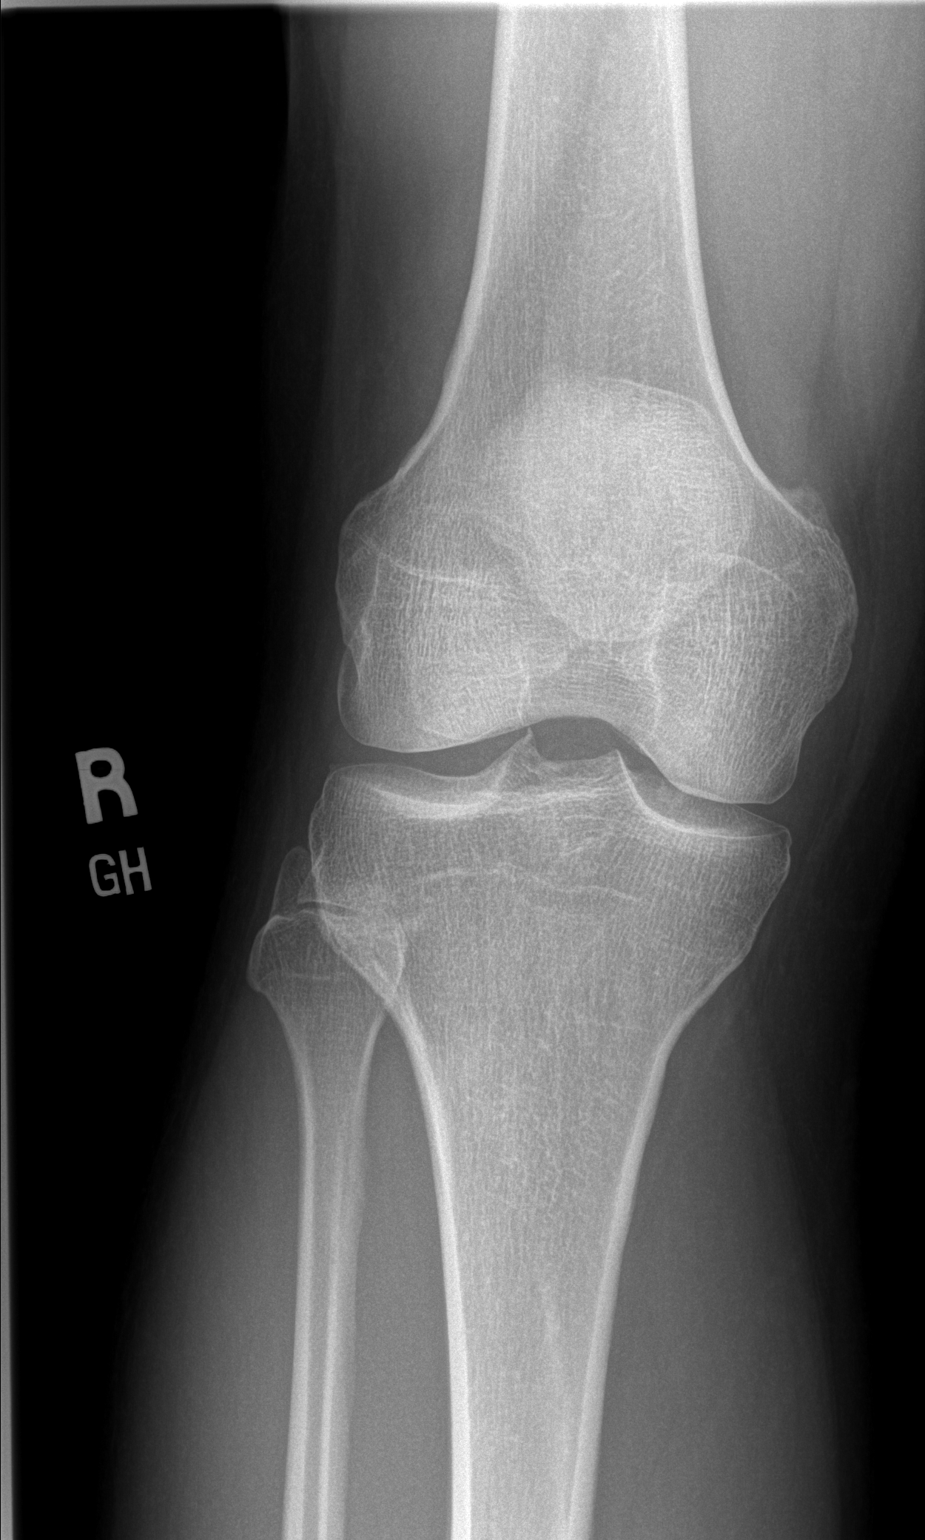

[w knee lat. right]
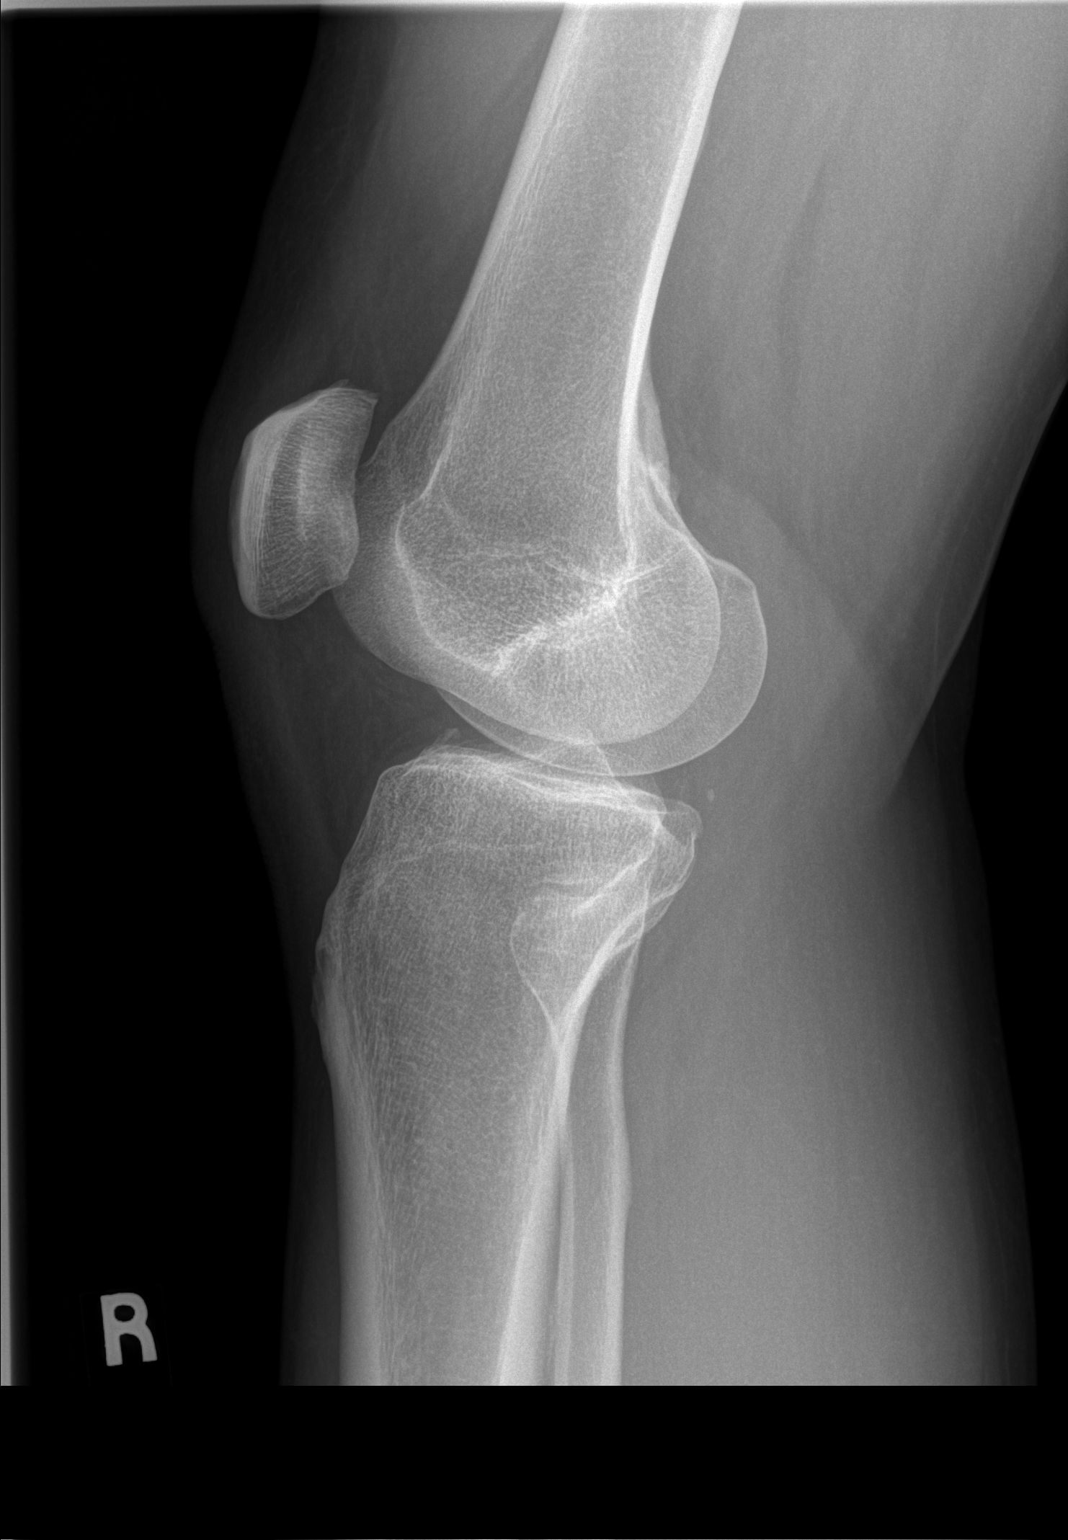

[2 of 2 positions shown; findings below may reference images not displayed]

FINDINGS: No evidence of fracture, dislocation, or joint effusion. No evidence
of arthropathy or other focal bone abnormality. Soft tissues are
unremarkable.
IMPRESSION: Negative.

## 2021-11-30 IMAGING — CR DG KNEE 1-2V*L*
2 series · 2 of 2 positions shown · non-contrast
Comparison: None.

CLINICAL DATA: Left knee pain

EXAM:
LEFT KNEE - 1-2 VIEW

[w knee ap left]
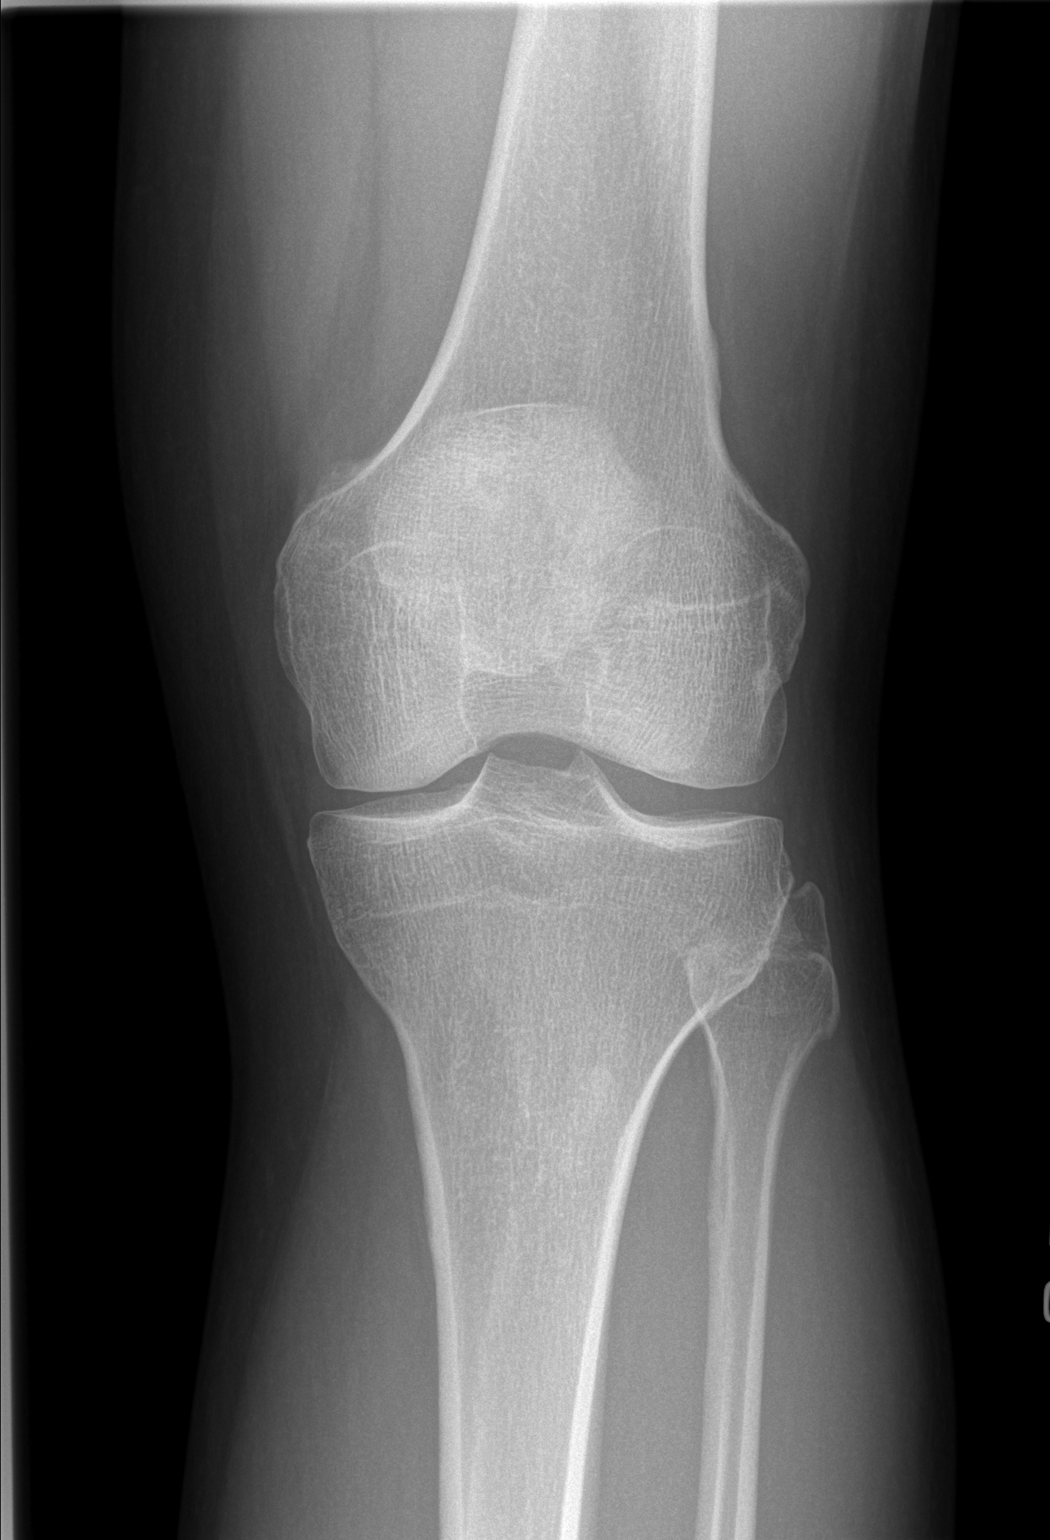

[w knee lat. left]
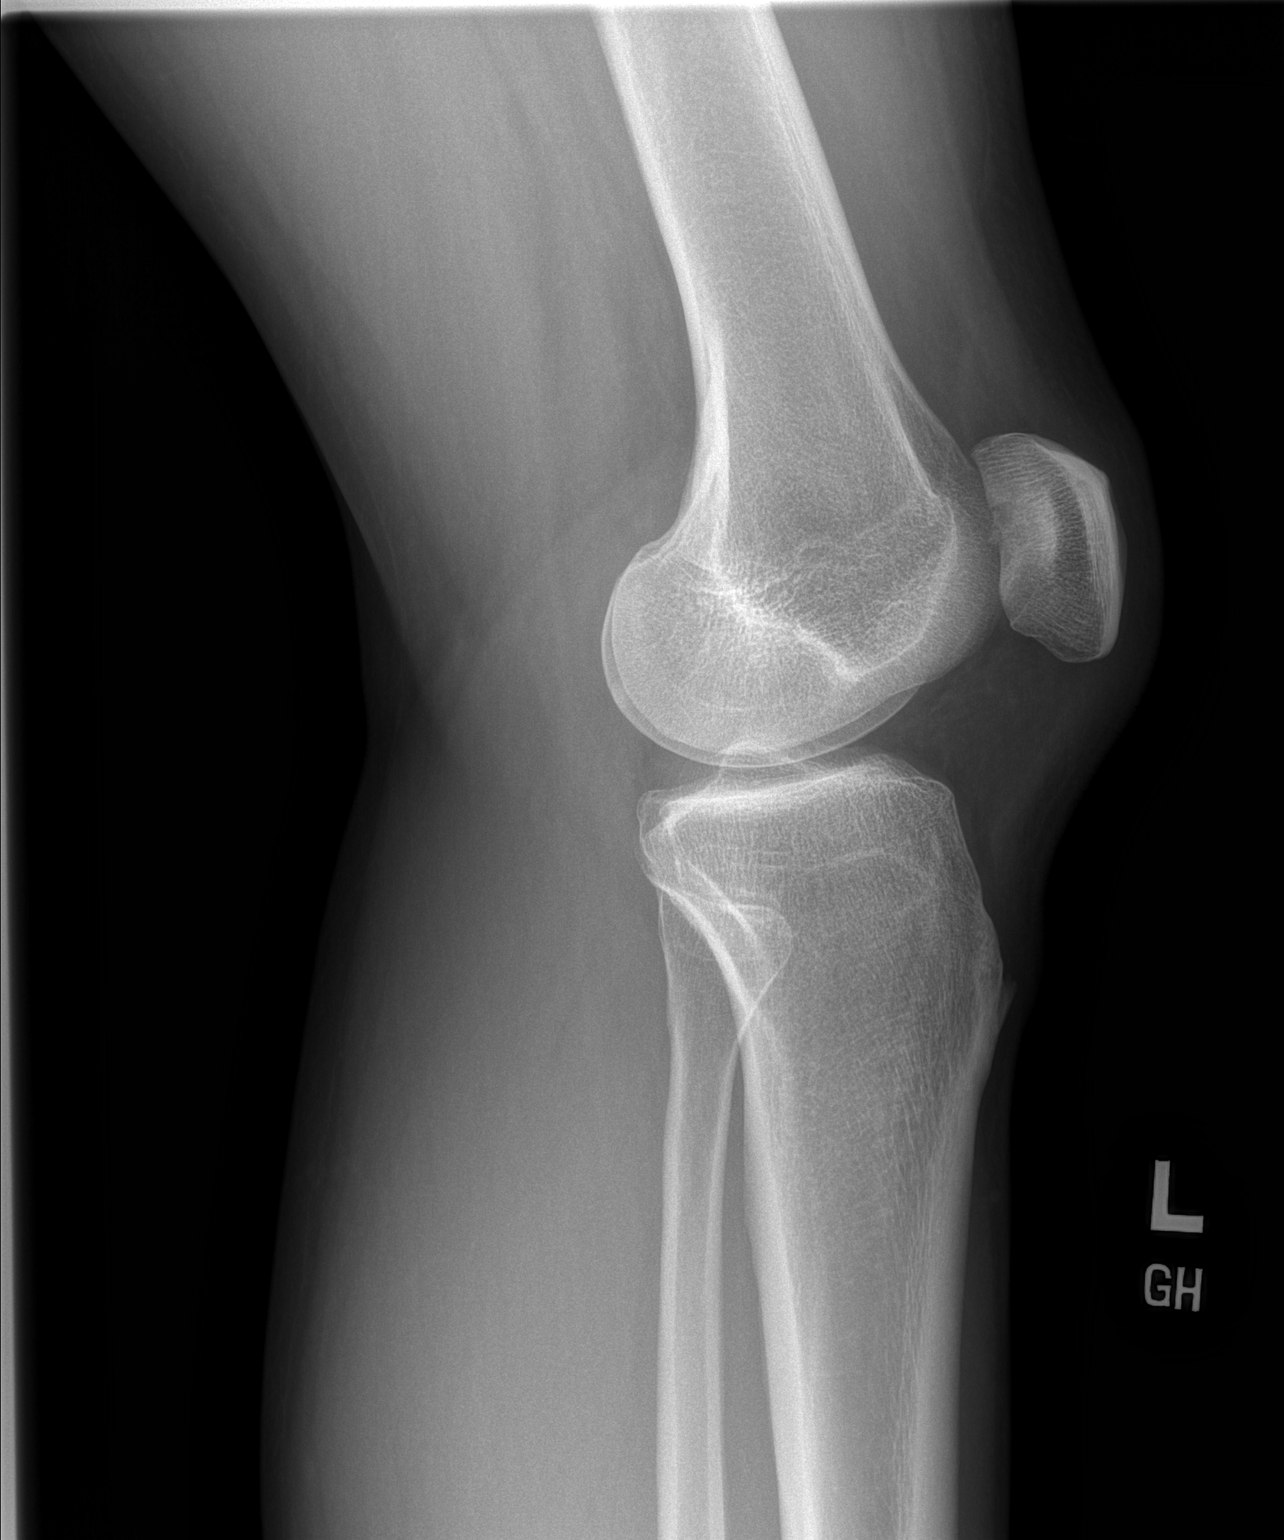

[2 of 2 positions shown; findings below may reference images not displayed]

FINDINGS: No fracture or malalignment. Mild patellofemoral degenerative
change. No sizable knee effusion.
IMPRESSION: Mild patellofemoral degenerative change. No acute osseous
abnormality.

## 2022-04-30 ENCOUNTER — Encounter (HOSPITAL_COMMUNITY): Payer: Self-pay

## 2022-04-30 ENCOUNTER — Emergency Department (HOSPITAL_COMMUNITY)
Admission: EM | Admit: 2022-04-30 | Discharge: 2022-04-30 | Disposition: A | Discharge status: Home / Self Care | Payer: BC Managed Care – PPO | Attending: Emergency Medicine | Admitting: Emergency Medicine

## 2022-04-30 ENCOUNTER — Emergency Department (HOSPITAL_BASED_OUTPATIENT_CLINIC_OR_DEPARTMENT_OTHER): Payer: BC Managed Care – PPO

## 2022-04-30 ENCOUNTER — Emergency Department (HOSPITAL_COMMUNITY): Payer: BC Managed Care – PPO

## 2022-04-30 ENCOUNTER — Other Ambulatory Visit: Payer: Self-pay

## 2022-04-30 DIAGNOSIS — M79605 Pain in left leg: Secondary | ICD-10-CM | POA: Diagnosis not present

## 2022-04-30 DIAGNOSIS — R252 Cramp and spasm: Secondary | ICD-10-CM

## 2022-04-30 DIAGNOSIS — R0602 Shortness of breath: Secondary | ICD-10-CM | POA: Diagnosis not present

## 2022-04-30 DIAGNOSIS — M79604 Pain in right leg: Secondary | ICD-10-CM | POA: Diagnosis not present

## 2022-04-30 DIAGNOSIS — R079 Chest pain, unspecified: Secondary | ICD-10-CM | POA: Diagnosis present

## 2022-04-30 LAB — CBC WITH DIFFERENTIAL/PLATELET
Abs Immature Granulocytes: 0.01 10*3/uL (ref 0.00–0.07)
Basophils Absolute: 0 10*3/uL (ref 0.0–0.1)
Basophils Relative: 1 %
Eosinophils Absolute: 0.4 10*3/uL (ref 0.0–0.5)
Eosinophils Relative: 7 %
HCT: 45.4 % (ref 39.0–52.0)
Hemoglobin: 15.6 g/dL (ref 13.0–17.0)
Immature Granulocytes: 0 %
Lymphocytes Relative: 36 %
Lymphs Abs: 2 10*3/uL (ref 0.7–4.0)
MCH: 30.9 pg (ref 26.0–34.0)
MCHC: 34.4 g/dL (ref 30.0–36.0)
MCV: 89.9 fL (ref 80.0–100.0)
Monocytes Absolute: 0.7 10*3/uL (ref 0.1–1.0)
Monocytes Relative: 12 %
Neutro Abs: 2.5 10*3/uL (ref 1.7–7.7)
Neutrophils Relative %: 44 %
Platelets: 198 10*3/uL (ref 150–400)
RBC: 5.05 MIL/uL (ref 4.22–5.81)
RDW: 12.3 % (ref 11.5–15.5)
WBC: 5.6 10*3/uL (ref 4.0–10.5)
nRBC: 0 % (ref 0.0–0.2)

## 2022-04-30 LAB — BASIC METABOLIC PANEL
Anion gap: 5 (ref 5–15)
BUN: 9 mg/dL (ref 6–20)
CO2: 26 mmol/L (ref 22–32)
Calcium: 9 mg/dL (ref 8.9–10.3)
Chloride: 106 mmol/L (ref 98–111)
Creatinine, Ser: 1 mg/dL (ref 0.61–1.24)
GFR, Estimated: >60 mL/min (ref >60)
Glucose, Bld: 113 mg/dL — ABNORMAL HIGH (ref 70–99)
Potassium: 4.4 mmol/L (ref 3.5–5.1)
Sodium: 137 mmol/L (ref 135–145)

## 2022-04-30 LAB — CK: Total CK: 133 U/L (ref 49–397)

## 2022-04-30 LAB — TROPONIN I (HIGH SENSITIVITY)
Troponin I (High Sensitivity): 5 ng/L (ref <18)
Troponin I (High Sensitivity): 3 ng/L (ref <18)

## 2022-04-30 LAB — D-DIMER, QUANTITATIVE: D-Dimer, Quant: <0.27 ug/mL-FEU (ref 0.00–0.50)

## 2022-04-30 LAB — MAGNESIUM: Magnesium: 2.2 mg/dL (ref 1.7–2.4)

## 2022-04-30 MED ORDER — KETOROLAC TROMETHAMINE 15 MG/ML IJ SOLN
15.0000 mg | Freq: Once | INTRAMUSCULAR | Status: AC
  Administered 2022-04-30: 15 mg via INTRAVENOUS
  Filled 2022-04-30: qty 1

## 2022-04-30 NOTE — Progress Notes (Signed)
VASCULAR LAB    Bilateral lower extremity venous duplex has been performed.  See CV proc for preliminary results.  Messaged negative results to Dr. Stevie Kern via secure chat  Sherren Kerns, RVT 04/30/2022, 8:36 AM

## 2022-04-30 NOTE — ED Provider Notes (Addendum)
[name] EMERGENCY DEPARTMENT Provider Note   CSN: [id number] Arrival date & time: 04/30/22  0414     History  Chief Complaint  Patient presents with   Chest Pain   Leg Pain    Casey [name] is a 51 y.o. male.  [name] is a 51 year old male with past medical history of bronchitis and COVID infection in 2022 presenting today for chest pain and bilateral leg pain.  Reports that the chest pain started this morning while sleeping.  The pain starts in the left chest, feels tight and achy and radiates down the left arm and left side of the neck.  Endorses intermittent shortness of breath but denies diaphoresis nausea vomiting.  States that the pain is worse with breathing and improved with ibuprofen.  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  As for the bilateral leg pain, reports that he has had a couple months of leg cramping and the pain worsened this morning just before the chest pain started prompting him to come into the emergency department for evaluation.  Also reports recent history of upper respiratory symptoms with a moderate amount of coughing.  Currently taking amoxicillin for the URI.    Chest Pain Associated symptoms: shortness of breath   Leg Pain      Home Medications Prior to Admission medications   Medication Sig Start Date End Date Taking? Authorizing Provider  albuterol (VENTOLIN HFA) 108 (90 Base) MCG/ACT inhaler Inhale 2 puffs into the lungs every 6 (six) hours as needed for wheezing or shortness of breath. 07/25/20   Ivonne Andrew, NP  zinc gluconate 50 MG tablet Take 50 mg by mouth daily.    [provider]      Allergies    Other and Zegerid [omeprazole]    Review of Systems   Review of Systems  Respiratory:  Positive for shortness of breath.   Cardiovascular:  Positive for chest pain.  Musculoskeletal:        Bilateral calf tenderness    Physical Exam Updated Vital  Signs BP 130/84   Pulse (!) 45   Temp 97.7 F (36.5 C) (Oral)   Resp 16   Ht 5\' 11"  (1.803 m)   Wt 97.5 kg   SpO2 99%   BMI 29.99 kg/m  Physical Exam Vitals and nursing note reviewed.  Constitutional:      Appearance: He is well-developed and normal weight.  HENT:     Head: Normocephalic and atraumatic.     Mouth/Throat:     Mouth: Mucous membranes are moist.  Eyes:     General:        Right eye: No discharge.        Left eye: No discharge.     Conjunctiva/sclera: Conjunctivae normal.  Cardiovascular:     Rate and Rhythm: Normal rate and regular rhythm.     Pulses: Normal pulses.     Heart sounds: Normal heart sounds. Heart sounds not distant. No murmur heard. Pulmonary:     Effort: Pulmonary effort is normal.     Breath sounds: Normal breath sounds. No wheezing, rhonchi or rales.  Abdominal:     General: Abdomen is flat.     Palpations: Abdomen is soft.  Skin:    General: Skin is warm and dry.  Neurological:     General: No focal deficit present.     Mental Status: He is alert.  Psychiatric:  Mood and Affect: Mood normal.     ED Results / Procedures / Treatments   Labs (all labs ordered are listed, but only abnormal results are displayed) Labs Reviewed  BASIC METABOLIC PANEL - Abnormal; Notable for the following components:      Result Value   Glucose, Bld 113 (*)    All other components within normal limits  CBC WITH DIFFERENTIAL/PLATELET  MAGNESIUM  D-DIMER, QUANTITATIVE  CK  TROPONIN I (HIGH SENSITIVITY)  TROPONIN I (HIGH SENSITIVITY)    EKG EKG Interpretation  Date/Time:  Thursday April 30 2022 04:22:43 EDT Ventricular Rate:  64 PR Interval:  164 QRS Duration: 90 QT Interval:  374 QTC Calculation: 385 R Axis:   77 Text Interpretation: Normal sinus rhythm Early repolarization Normal ECG When compared with ECG of 26-May-2020 06:46, No significant change was found Confirmed by Dione Booze ([id number]) on 04/30/2022 5:56:18 AM  Radiology VAS  [place] LOWER EXTREMITY VENOUS (DVT) (7a-7p)  Result Date: 04/30/2022  Lower Venous DVT Study Patient Name:  Casey [name]  Date of Exam:   04/30/2022 Medical Rec #: [id number]       Accession #:    [id number] Date of Birth: [birth year]       Patient Gender: M Patient Age:   42 years Exam Location:  [facility] Procedure:      VAS [place] LOWER EXTREMITY VENOUS (DVT) Referring Phys: Riki Sheer --------------------------------------------------------------------------------  Indications: Leg cramping for a few months, chest pain.  Comparison Study: No prior study on file Performing Technologist: Sherren Kerns RVS  Examination Guidelines: A complete evaluation includes B-mode imaging, spectral Doppler, color Doppler, and power Doppler as needed of all accessible portions of each vessel. Bilateral testing is considered an integral part of a complete examination. Limited examinations for reoccurring indications may be performed as noted. The reflux portion of the exam is performed with the patient in reverse Trendelenburg.  +---------+---------------+---------+-----------+----------+--------------+ RIGHT    CompressibilityPhasicitySpontaneityPropertiesThrombus Aging +---------+---------------+---------+-----------+----------+--------------+ CFV      Full           Yes      Yes                                 +---------+---------------+---------+-----------+----------+--------------+ SFJ      Full                                                        +---------+---------------+---------+-----------+----------+--------------+ FV Prox  Full                                                        +---------+---------------+---------+-----------+----------+--------------+ FV Mid   Full                                                        +---------+---------------+---------+-----------+----------+--------------+ FV DistalFull                                                         +---------+---------------+---------+-----------+----------+--------------+  PFV      Full                                                        +---------+---------------+---------+-----------+----------+--------------+ POP      Full           Yes      Yes                                 +---------+---------------+---------+-----------+----------+--------------+ PTV      Full                                                        +---------+---------------+---------+-----------+----------+--------------+ PERO     Full                                                        +---------+---------------+---------+-----------+----------+--------------+   +---------+---------------+---------+-----------+----------+--------------+ LEFT     CompressibilityPhasicitySpontaneityPropertiesThrombus Aging +---------+---------------+---------+-----------+----------+--------------+ CFV      Full           Yes      Yes                                 +---------+---------------+---------+-----------+----------+--------------+ SFJ      Full                                                        +---------+---------------+---------+-----------+----------+--------------+ FV Prox  Full                                                        +---------+---------------+---------+-----------+----------+--------------+ FV Mid   Full                                                        +---------+---------------+---------+-----------+----------+--------------+ FV DistalFull                                                        +---------+---------------+---------+-----------+----------+--------------+ PFV      Full                                                        +---------+---------------+---------+-----------+----------+--------------+  POP      Full           Yes      Yes                                  +---------+---------------+---------+-----------+----------+--------------+ PTV      Full                                                        +---------+---------------+---------+-----------+----------+--------------+ PERO     Full                                                        +---------+---------------+---------+-----------+----------+--------------+     Summary: BILATERAL: - No evidence of deep vein thrombosis seen in the lower extremities, bilaterally. -No evidence of popliteal cyst, bilaterally. RIGHT: - Ultrasound characteristics of enlarged lymph nodes are noted in the groin.  LEFT: - Ultrasound characteristics of enlarged lymph nodes noted in the groin.  *See table(s) above for measurements and observations.    Preliminary    DG Chest 2 View  Result Date: 04/30/2022 CLINICAL DATA:  51 year old male with history of chest pain. EXAM: CHEST - 2 VIEW COMPARISON:  Chest x-ray 06/27/2020. FINDINGS: Lung volumes are normal. No consolidative airspace disease. No pleural effusions. No pneumothorax. No pulmonary nodule or mass noted. Pulmonary vasculature and the cardiomediastinal silhouette are within normal limits. IMPRESSION: No radiographic evidence of acute cardiopulmonary disease. Electronically Signed   By: Trudie Reed M.D.   On: 04/30/2022 05:10      Medications Ordered in ED Medications  ketorolac (TORADOL) 15 MG/ML injection 15 mg (15 mg Intravenous Given 04/30/22 0923)    ED Course/ Medical Decision Making/ A&P Clinical Course as of 04/30/22 0940  Thu Apr 30, 2022  0847 VAS [place] LOWER EXTREMITY VENOUS (DVT) (7a-7p) [JR]  0900 VAS [place] LOWER EXTREMITY VENOUS (DVT) (7a-7p) [JR]  0901 VAS [place] LOWER EXTREMITY VENOUS (DVT) (7a-7p) [JR]  0903 VAS [place] LOWER EXTREMITY VENOUS (DVT) (7a-7p) [JR]    Clinical Course User Index [JR] Gareth Eagle, PA-C                           Medical Decision Making Ciaran is here today for leg pain that is progressively worsened over  the past couple months and new chest pain as of this morning.  Deferred differential diagnosis for his presentation includes acute coronary syndrome, PE associated with DVT, and muscular skeletal given persistent coughing with recent history of URI.  On exam patient is breathing comfortably, chest pain is non-reproducible,  and bilateral calves were tender on palpation.  Chest pain: EKG revealed normal sinus rhythm.  Serial troponin ordered in reviewed and are within normal limits.   Wells score for PE is 3 points making moderate risk. Dimer within normal limits. I viewed and interpreted the CXR which revealed no acute or abnormal findings. Heart score is 2 which classifies him as low risk for cardiac/coronary etiology. Ordered toradol once for pain.   Bilateral leg pain: Ordered, reviewed and interpreted bilateral  venous ultrasounds of the lower extremities which were both normal for DVT incidental finding notable for lymph node enlargement in the right and left groin.  Otherwise no acute findings. CK is normal.      After the interventions noted above I re-evaluated patient and found that they have : improved.  Disposition:  After consideration of the diagnostic results and the patients response to treatment, I feel that the patent would benefit from close f/u with outpatient cardiologist. Otherwise is appropriate to discharge home.     Amount and/or Complexity of Data Reviewed Labs: ordered. Radiology:  Decision-making details documented in ED Course.  Risk Prescription drug management.           Final Clinical Impression(s) / ED Diagnoses Final diagnoses:  Chest pain, unspecified type  Right leg pain  Left leg pain    Rx / DC Orders ED Discharge Orders          Ordered    Ambulatory referral to Cardiology       Comments: If you have not heard from the Cardiology office within the next 72 hours please call [phone].   04/30/22 0935               Gareth Eagle, PA-C 04/30/22 0921    Gareth Eagle, PA-C 04/30/22 0940    Milagros Loll, MD 05/01/22 [phone]

## 2022-04-30 NOTE — ED Notes (Addendum)
Pt provided written and verbal d/c instructions. Pt verbaizes understanding. Pt left via ambulation.

## 2022-04-30 NOTE — ED Notes (Signed)
Patient returned from Ultrasound. 

## 2022-04-30 NOTE — ED Notes (Signed)
Patient transported to Ultrasound 

## 2022-04-30 NOTE — ED Provider Triage Note (Signed)
Emergency Medicine Provider Triage Evaluation Note  Casey [name] , a 51 y.o. male  was evaluated in triage.  Pt complains of left chest pain that had sudden onset while laying in bed tonight.  No shortness of breath or palpitations.  Mostly resolved at this time.  Patient also reports intermittent leg cramping in the calves bilaterally for several months worse tonight.  Is currently on amoxicillin for sinus infection, has been having nonproductive cough for several days.  No history of cardiopulmonary chronic conditions.  Review of Systems  Positive: Chest pain, leg cramping, cough Negative: Nausea vomiting diarrhea, palpitation, shortness of breath  Physical Exam  BP 129/80 (BP Location: [name] Arm)   Pulse 62   Temp 98.7 F (37.1 C) (Oral)   Resp 18   Ht 5\' 11"  (1.803 m)   Wt 97.5 kg   SpO2 92%   BMI 29.99 kg/m  Gen:   Awake, no distress   Resp:  Normal effort  MSK:   Moves extremities without difficulty  Other:  Dry cough throughout exam.  Lungs CTA B.  RRR, no M/R/G.  Neurovascular intact in all extremities.  No calf tenderness palpation.  Left-sided chest wall tenderness on palpation.  Medical Decision Making  Medically screening exam initiated at 4:46 AM.  Appropriate orders placed.  was informed that the remainder of the evaluation will be completed by another provider, this initial triage assessment does not replace that evaluation, and the importance of remaining in the ED until their evaluation is complete.  This chart was dictated using voice recognition software, Dragon. Despite the best efforts of this provider to proofread and correct errors, errors may still occur which can change documentation meaning.    Casey Right, PA-C 04/30/22 [phone]

## 2022-04-30 NOTE — ED Triage Notes (Signed)
Reports intermittent leg cramping for several months. Admits he has mentioned it to PCP several times.   Presents to ER tonight after experiencing sudden sternal chest pains while laying in bed tonight. Denies shob.  Currently taking amoxicillin for sinus infection.

## 2022-04-30 NOTE — Discharge Instructions (Addendum)
Diagnosed with chest pain and bilateral leg pain.  Cardiopulmonary work-up was overall unremarkable but given the nature of your chest pain strongly recommending close follow-up with cardiologist.  If if Chest pain returns or is worse with shortness of breath sweating and nausea vomiting please return to the emergency department.  For leg pain recommend close follow-up with primary care provider.

## 2022-05-07 ENCOUNTER — Ambulatory Visit: Payer: BC Managed Care – PPO | Admitting: Cardiology

## 2022-05-07 ENCOUNTER — Encounter: Payer: Self-pay | Admitting: Cardiology

## 2022-05-07 VITALS — BP 140/80 | HR 64 | Ht 71.0 in | Wt 211.0 lb

## 2022-05-07 DIAGNOSIS — R9431 Abnormal electrocardiogram [ECG] [EKG]: Secondary | ICD-10-CM | POA: Diagnosis not present

## 2022-05-07 DIAGNOSIS — R072 Precordial pain: Secondary | ICD-10-CM | POA: Diagnosis not present

## 2022-05-07 MED ORDER — METOPROLOL TARTRATE 50 MG PO TABS: 50.0000 mg | ORAL_TABLET | ORAL | 0 refills | Status: DC

## 2022-05-07 NOTE — Progress Notes (Signed)
Cardiology Office Note:    Date:  05/07/2022   ID:  Casey Casey [name], DOB Feb 17, 1971, MRN [id number]  PCP:  Casey Dills, MD  [facility] Cardiologist:  Casey Schultz, MD  [facility] Electrophysiologist:  None   Referring MD: Casey Eagle, PA-C    History of Present Illness:    Casey Casey [name] is a 51 y.o. male here for post-hospital follow-up of chest pain.   He was seen at the ED 04/30/22 with concerns for chest pain. EKG revealed normal sinus rhythm.  Serial troponin ordered in reviewed and are within normal limits.   Wells score for PE is 3 points making moderate risk. Dimer within normal limits.  His father had CABG.  Previously here for the evaluation of shortness of breath and abnormal EKG at the request of Casey Seller, NP.  Recent Covid pneumonia.  Was in the emergency department on 05/26/2020.  Diagnosed with Covid.  Treated with monoclonal antibody infusion while in the emergency department.  EKG showed sinus rhythm with left atrial enlargement and ST elevation suggestive of acute pericarditis.  His troponins were normal x2.  Shortness of breath with climbing stairs.  Cough.  He is here today to evaluate overall cardiac function.  Repeat EKG during an office visit on 06/11/2020 personally reviewed and interpreted shows diffuse J-point elevation especially in leads V4, V5, V6.  No real change from prior EKG on 05/26/2020.   HR personal. 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  Had to postpone this because of Covid.  Prior syncopal episode when he had COVID.   At the last visit, when coughing had some discomfort. Was eating Casey [name]. Used incentive spirometer. His appetite was improving and had lost weight. Was struggling to get to the mailbox and back.  Slowly getting better.  Today, he describes the chest pain that sent him to the ED as occurring suddenly during the night when he was trying to sleep. He has not noticed chest pain since then.    He is not a smoker.  He denies any palpitations, shortness of breath, or peripheral edema. No lightheadedness, headaches, syncope, orthopnea, or PND.   His father had heart problems and a CABG.    Past Medical History:  Diagnosis Date   Bronchitis    Chest pain on exertion    COVID-19    Hyperlipidemia    Obesity    Pneumonia    Respiratory failure (HCC)    SOB (shortness of breath)     Past Surgical History:  Procedure Laterality Date   APPENDECTOMY     SKIN GRAFT      Current Medications: Current Meds  Medication Sig   albuterol (VENTOLIN HFA) 108 (90 Base) MCG/ACT inhaler Inhale 2 puffs into the lungs every 6 (six) hours as needed for wheezing or shortness of breath.   metoprolol tartrate (LOPRESSOR) 50 MG tablet Take 1 tablet (50 mg total) by mouth as directed. Take 1 tablet 2 hours before your CT scan   zinc gluconate 50 MG tablet Take 50 mg by mouth daily.     Allergies:   Other and Zegerid [omeprazole]   Social History   Socioeconomic History   Marital status: Married    Spouse name: Not on file   Number of children: Not on file   Years of education: Not on file   Highest education level: Not on file  Occupational History   Not on file  Tobacco Use  Smoking status: Former   Smokeless tobacco: Never  Substance and Sexual Activity   Alcohol use: No   Drug use: Never   Sexual activity: Not on file  Other Topics Concern   Not on file  Social History Narrative   Not on file   Social Determinants of Health   Financial Resource Strain: Not on file  Food Insecurity: Not on file  Transportation Needs: Not on file  Physical Activity: Not on file  Stress: Not on file  Social Connections: Not on file     Family History: Father had CABG coronary disease.   ROS:   Please see the history of present illness.  All other systems reviewed and are negative.  EKGs/Labs/Other Studies Reviewed:    The following studies were reviewed today: Chest  x-ray personally reviewed from both 8/22 and 06/11/2020-patchy infiltrates, Covid pneumonia.  Bilateral LE Venous Doppler 04/30/2022: Summary:  BILATERAL:  - No evidence of deep vein thrombosis seen in the lower extremities,  bilaterally.  -No evidence of popliteal cyst, bilaterally.  Casey [name]:  - Ultrasound characteristics of enlarged lymph nodes are noted in the  groin.     LEFT:  - Ultrasound characteristics of enlarged lymph nodes noted in the groin.       Echocardiogram 07/01/2020: IMPRESSIONS   1. Global longitudinal strain is -22.5%. Left ventricular ejection  fraction, by estimation, is 55 to 60%. The left ventricle has normal  function. The left ventricle has no regional wall motion abnormalities.  Left ventricular diastolic parameters were  normal.   2. Casey [name] ventricular systolic function is normal. The Casey [name] ventricular  size is normal.   3. The mitral valve is abnormal. Trivial mitral valve regurgitation.   4. The aortic valve is abnormal. Aortic valve regurgitation is trivial.  Mild aortic valve sclerosis is present, with no evidence of aortic valve  stenosis.   5. The inferior vena cava is normal in size with greater than 50%  respiratory variability, suggesting Casey [name] atrial pressure of 3 mmHg.   EKG: EKG was personally reviewed. 05/07/22: EKG was not ordered.   Recent Labs: 04/30/2022: BUN 9; Creatinine, Ser 1.00; Hemoglobin 15.6; Magnesium 2.2; Platelets 198; Potassium 4.4; Sodium 137  Recent Lipid Panel No results found for: "CHOL", "TRIG", "HDL", "CHOLHDL", "VLDL", "LDLCALC", "LDLDIRECT"  Physical Exam:    VS:  BP (!) 140/80 (BP Location: Left Arm, Patient Position: Sitting, Cuff Size: Normal)   Pulse 64   Ht 5\' 11"  (1.803 m)   Wt 211 lb (95.7 kg)   SpO2 94%   BMI 29.43 kg/m     Wt Readings from Last 3 Encounters:  05/07/22 211 lb (95.7 kg)  04/30/22 215 lb (97.5 kg)  07/25/20 215 lb 0.1 oz (97.5 kg)     GEN:  Well nourished, well developed in no acute  distress HEENT: Normal NECK: No JVD; No carotid bruits LYMPHATICS: No lymphadenopathy CARDIAC: RRR, no murmurs, rubs, gallops RESPIRATORY:  Clear to auscultation without rales, wheezing or rhonchi  ABDOMEN: Soft, non-tender, non-distended MUSCULOSKELETAL:  No edema; No deformity  SKIN: Warm and dry NEUROLOGIC:  Alert and oriented x 3 PSYCHIATRIC:  Normal affect   ASSESSMENT:    1. Precordial pain   2. Nonspecific abnormal electrocardiogram (ECG) (EKG)     PLAN:    In order of problems listed above:  Precordial chest pain - Given his recent ER visit, family history of coronary artery disease, J-point elevation on ECG, we will go ahead and check a coronary CT scan  to ensure that there is no evidence of significant coronary plaque present.  Abnormal EKG -J-point elevation on ECG.  Previous echocardiogram 2 years ago showed normal function.  Normal thickness.    We will follow-up with results of coronary CT scan  Hemoglobin A1c 5.5 hemoglobin 15.6 creatinine 1.0 ALT 27 LDL 124  Follow-up: TBD based on results of testing.  Medication Adjustments/Labs and Tests Ordered: Current medicines are reviewed at length with the patient today.  Concerns regarding medicines are outlined above.  Orders Placed This Encounter  Procedures   CT CORONARY MORPH W/CTA COR W/SCORE W/CA W/CM &/OR WO/CM   Meds ordered this encounter  Medications   metoprolol tartrate (LOPRESSOR) 50 MG tablet    Sig: Take 1 tablet (50 mg total) by mouth as directed. Take 1 tablet 2 hours before your CT scan    Dispense:  1 tablet    Refill:  0    Patient Instructions  Medication Instructions:  The current medical regimen is effective;  continue present plan and medications.  *If you need a refill on your cardiac medications before your next appointment, please call your pharmacy*   Testing/Procedures:   Your cardiac CT will be scheduled at:   [facility] [address] [place],  [place] [address] [phone]  Please arrive at the [facility] and Children's Entrance (Entrance C2) of [facility] 30 minutes prior to test start time. You can use the FREE valet parking offered at entrance C (encouraged to control the heart rate for the test)  Proceed to the [facility] Radiology Department (first floor) to check-in and test prep.  All radiology patients and guests should use entrance C2 at [facility], accessed from [facility], even though the hospital's physical address listed is [address]    Please follow these instructions carefully (unless otherwise directed):  Hold all erectile dysfunction medications at least 3 days (72 hrs) prior to test.  On the Night Before the Test: Be sure to Drink plenty of water. Do not consume any caffeinated/decaffeinated beverages or chocolate 12 hours prior to your test. Do not take any antihistamines 12 hours prior to your test.  On the Day of the Test: Drink plenty of water until 1 hour prior to the test. Do not eat any food 4 hours prior to the test. You may take your regular medications prior to the test.  Take metoprolol (Lopressor) two hours prior to test. HOLD Furosemide/Hydrochlorothiazide morning of the test.  After the Test: Drink plenty of water. After receiving IV contrast, you may experience a mild flushed feeling. This is normal. On occasion, you may experience a mild rash up to 24 hours after the test. This is not dangerous. If this occurs, you can take Benadryl 25 mg and increase your fluid intake. If you experience trouble breathing, this can be serious. If it is severe call 911 IMMEDIATELY. If it is mild, please call our office. If you take any of these medications: Glipizide/Metformin, Avandament, Glucavance, please do not take 48 hours after completing test unless otherwise instructed.  We will call to schedule your test 2-4 weeks out understanding that some insurance companies  will need an authorization prior to the service being performed.   For non-scheduling related questions, please contact the cardiac imaging nurse navigator should you have any questions/concerns: Rockwell Alexandria, Cardiac Imaging Nurse Navigator Larey Brick, Cardiac Imaging Nurse Navigator [facility] Direct Office Dial: [phone]  For scheduling needs, including cancellations and rescheduling, please call [place], [phone].  Follow-Up: At [facility], you and your health needs are our priority.  As part of our continuing mission to provide you with exceptional heart care, we have created designated Provider Care Teams.  These Care Teams include your primary Cardiologist (physician) and Advanced Practice Providers (APPs -  Physician Assistants and Nurse Practitioners) who all work together to provide you with the care you need, when you need it.  We recommend signing up for the patient portal called "MyChart".  Sign up information is provided on this After Visit Summary.  MyChart is used to connect with patients for Virtual Visits (Telemedicine).  Patients are able to view lab/test results, encounter notes, upcoming appointments, etc.  Non-urgent messages can be sent to your provider as well.   To learn more about what you can do with MyChart, go to [email or web address].    Your next appointment:   Follow up will be based on the results of the above testing.   Important Information About Sugar        Scribe utilized for medical information.  IDonato Schultz, MD, have reviewed all documentation for this visit. The documentation on 05/07/22 for the exam, diagnosis, procedures, and orders are all accurate and complete.   Signed, Casey Schultz, MD  05/07/2022 9:48 AM    [facility]

## 2022-05-07 NOTE — Patient Instructions (Signed)
Medication Instructions:  The current medical regimen is effective;  continue present plan and medications.  *If you need a refill on your cardiac medications before your next appointment, please call your pharmacy*   Testing/Procedures:   Your cardiac CT will be scheduled at:   [facility] [address] [place], [place] [address] [phone]  Please arrive at the [facility] and Children's Entrance (Entrance C2) of [facility] 30 minutes prior to test start time. You can use the FREE valet parking offered at entrance C (encouraged to control the heart rate for the test)  Proceed to the [facility] Radiology Department (first floor) to check-in and test prep.  All radiology patients and guests should use entrance C2 at [facility], accessed from [facility], even though the hospital's physical address listed is [address]    Please follow these instructions carefully (unless otherwise directed):  Hold all erectile dysfunction medications at least 3 days (72 hrs) prior to test.  On the Night Before the Test: Be sure to Drink plenty of water. Do not consume any caffeinated/decaffeinated beverages or chocolate 12 hours prior to your test. Do not take any antihistamines 12 hours prior to your test.  On the Day of the Test: Drink plenty of water until 1 hour prior to the test. Do not eat any food 4 hours prior to the test. You may take your regular medications prior to the test.  Take metoprolol (Lopressor) two hours prior to test. HOLD Furosemide/Hydrochlorothiazide morning of the test.  After the Test: Drink plenty of water. After receiving IV contrast, you may experience a mild flushed feeling. This is normal. On occasion, you may experience a mild rash up to 24 hours after the test. This is not dangerous. If this occurs, you can take Benadryl 25 mg and increase your fluid intake. If you experience trouble breathing, this  can be serious. If it is severe call 911 IMMEDIATELY. If it is mild, please call our office. If you take any of these medications: Glipizide/Metformin, Avandament, Glucavance, please do not take 48 hours after completing test unless otherwise instructed.  We will call to schedule your test 2-4 weeks out understanding that some insurance companies will need an authorization prior to the service being performed.   For non-scheduling related questions, please contact the cardiac imaging nurse navigator should you have any questions/concerns: Rockwell Alexandria, Cardiac Imaging Nurse Navigator Larey Brick, Cardiac Imaging Nurse Navigator [facility] Direct Office Dial: [phone]   For scheduling needs, including cancellations and rescheduling, please call [place], [phone].  Follow-Up: At [facility], you and your health needs are our priority.  As part of our continuing mission to provide you with exceptional heart care, we have created designated Provider Care Teams.  These Care Teams include your primary Cardiologist (physician) and Advanced Practice Providers (APPs -  Physician Assistants and Nurse Practitioners) who all work together to provide you with the care you need, when you need it.  We recommend signing up for the patient portal called "MyChart".  Sign up information is provided on this After Visit Summary.  MyChart is used to connect with patients for Virtual Visits (Telemedicine).  Patients are able to view lab/test results, encounter notes, upcoming appointments, etc.  Non-urgent messages can be sent to your provider as well.   To learn more about what you can do with MyChart, go to [email or web address].    Your next appointment:   Follow  up will be based on the results of the above testing.   Important Information About Sugar

## 2022-06-18 ENCOUNTER — Observation Stay (HOSPITAL_BASED_OUTPATIENT_CLINIC_OR_DEPARTMENT_OTHER): Payer: BC Managed Care – PPO

## 2022-06-18 ENCOUNTER — Encounter (HOSPITAL_COMMUNITY)
Admission: EM | Disposition: A | Discharge status: Home / Self Care | Payer: Self-pay | Source: Home / Self Care | Attending: Emergency Medicine

## 2022-06-18 ENCOUNTER — Observation Stay (HOSPITAL_COMMUNITY)
Admission: EM | Admit: 2022-06-18 | Discharge: 2022-06-19 | Disposition: A | Discharge status: Home / Self Care | Payer: BC Managed Care – PPO | Attending: Internal Medicine | Admitting: Internal Medicine

## 2022-06-18 ENCOUNTER — Emergency Department (HOSPITAL_COMMUNITY): Payer: BC Managed Care – PPO

## 2022-06-18 ENCOUNTER — Encounter (HOSPITAL_COMMUNITY): Payer: Self-pay | Admitting: Internal Medicine

## 2022-06-18 ENCOUNTER — Other Ambulatory Visit: Payer: Self-pay

## 2022-06-18 DIAGNOSIS — E860 Dehydration: Secondary | ICD-10-CM | POA: Insufficient documentation

## 2022-06-18 DIAGNOSIS — J4520 Mild intermittent asthma, uncomplicated: Secondary | ICD-10-CM | POA: Diagnosis not present

## 2022-06-18 DIAGNOSIS — R079 Chest pain, unspecified: Secondary | ICD-10-CM | POA: Diagnosis present

## 2022-06-18 DIAGNOSIS — R7309 Other abnormal glucose: Secondary | ICD-10-CM | POA: Diagnosis not present

## 2022-06-18 DIAGNOSIS — I10 Essential (primary) hypertension: Secondary | ICD-10-CM | POA: Insufficient documentation

## 2022-06-18 DIAGNOSIS — Z8616 Personal history of COVID-19: Secondary | ICD-10-CM | POA: Insufficient documentation

## 2022-06-18 DIAGNOSIS — Z20822 Contact with and (suspected) exposure to covid-19: Secondary | ICD-10-CM | POA: Diagnosis not present

## 2022-06-18 DIAGNOSIS — Z87891 Personal history of nicotine dependence: Secondary | ICD-10-CM | POA: Insufficient documentation

## 2022-06-18 DIAGNOSIS — R001 Bradycardia, unspecified: Secondary | ICD-10-CM

## 2022-06-18 DIAGNOSIS — E876 Hypokalemia: Secondary | ICD-10-CM | POA: Insufficient documentation

## 2022-06-18 DIAGNOSIS — I5030 Unspecified diastolic (congestive) heart failure: Secondary | ICD-10-CM | POA: Diagnosis not present

## 2022-06-18 DIAGNOSIS — I358 Other nonrheumatic aortic valve disorders: Secondary | ICD-10-CM

## 2022-06-18 DIAGNOSIS — E871 Hypo-osmolality and hyponatremia: Secondary | ICD-10-CM | POA: Insufficient documentation

## 2022-06-18 DIAGNOSIS — Z79899 Other long term (current) drug therapy: Secondary | ICD-10-CM | POA: Diagnosis not present

## 2022-06-18 DIAGNOSIS — R55 Syncope and collapse: Principal | ICD-10-CM | POA: Diagnosis present

## 2022-06-18 LAB — CBC WITH DIFFERENTIAL/PLATELET
Abs Immature Granulocytes: 0.03 10*3/uL (ref 0.00–0.07)
Basophils Absolute: 0 10*3/uL (ref 0.0–0.1)
Basophils Relative: 0 %
Eosinophils Absolute: 0 10*3/uL (ref 0.0–0.5)
Eosinophils Relative: 0 %
HCT: 46.8 % (ref 39.0–52.0)
Hemoglobin: 17 g/dL (ref 13.0–17.0)
Immature Granulocytes: 0 %
Lymphocytes Relative: 24 %
Lymphs Abs: 2.5 10*3/uL (ref 0.7–4.0)
MCH: 31.3 pg (ref 26.0–34.0)
MCHC: 36.3 g/dL — ABNORMAL HIGH (ref 30.0–36.0)
MCV: 86 fL (ref 80.0–100.0)
Monocytes Absolute: 1 10*3/uL (ref 0.1–1.0)
Monocytes Relative: 10 %
Neutro Abs: 6.6 10*3/uL (ref 1.7–7.7)
Neutrophils Relative %: 66 %
Platelets: 241 10*3/uL (ref 150–400)
RBC: 5.44 MIL/uL (ref 4.22–5.81)
RDW: 12.3 % (ref 11.5–15.5)
WBC: 10.1 10*3/uL (ref 4.0–10.5)
nRBC: 0 % (ref 0.0–0.2)

## 2022-06-18 LAB — COMPREHENSIVE METABOLIC PANEL
ALT: 23 U/L (ref 0–44)
AST: 23 U/L (ref 15–41)
Albumin: 4.5 g/dL (ref 3.5–5.0)
Alkaline Phosphatase: 81 U/L (ref 38–126)
Anion gap: 13 (ref 5–15)
BUN: 13 mg/dL (ref 6–20)
CO2: 24 mmol/L (ref 22–32)
Calcium: 9.6 mg/dL (ref 8.9–10.3)
Chloride: 96 mmol/L — ABNORMAL LOW (ref 98–111)
Creatinine, Ser: 1.34 mg/dL — ABNORMAL HIGH (ref 0.61–1.24)
GFR, Estimated: >60 mL/min (ref >60)
Glucose, Bld: 162 mg/dL — ABNORMAL HIGH (ref 70–99)
Potassium: 3.3 mmol/L — ABNORMAL LOW (ref 3.5–5.1)
Sodium: 133 mmol/L — ABNORMAL LOW (ref 135–145)
Total Bilirubin: 2.4 mg/dL — ABNORMAL HIGH (ref 0.3–1.2)
Total Protein: 7.7 g/dL (ref 6.5–8.1)

## 2022-06-18 LAB — LIPID PANEL
Cholesterol: 174 mg/dL (ref 0–200)
HDL: 26 mg/dL — ABNORMAL LOW (ref >40)
LDL Cholesterol: 138 mg/dL — ABNORMAL HIGH (ref 0–99)
Total CHOL/HDL Ratio: 6.7 RATIO
Triglycerides: 48 mg/dL (ref <150)
VLDL: 10 mg/dL (ref 0–40)

## 2022-06-18 LAB — HEMOGLOBIN A1C
Hgb A1c MFr Bld: 5.4 % (ref 4.8–5.6)
Mean Plasma Glucose: 108.28 mg/dL

## 2022-06-18 LAB — APTT: aPTT: 23 seconds — ABNORMAL LOW (ref 24–36)

## 2022-06-18 LAB — RESP PANEL BY RT-PCR (FLU A&B, COVID) ARPGX2
Influenza A by PCR: NEGATIVE
Influenza B by PCR: NEGATIVE
SARS Coronavirus 2 by RT PCR: NEGATIVE

## 2022-06-18 LAB — ECHOCARDIOGRAM COMPLETE
Area-P 1/2: 3.45 cm2
Height: 71 in
S' Lateral: 2 cm
Weight: 3280 oz

## 2022-06-18 LAB — TSH: TSH: 2.411 u[IU]/mL (ref 0.350–4.500)

## 2022-06-18 LAB — TROPONIN I (HIGH SENSITIVITY)
Troponin I (High Sensitivity): 8 ng/L (ref <18)
Troponin I (High Sensitivity): 9 ng/L (ref <18)

## 2022-06-18 LAB — PROTIME-INR
INR: 1.2 (ref 0.8–1.2)
Prothrombin Time: 14.6 seconds (ref 11.4–15.2)

## 2022-06-18 LAB — HIV ANTIBODY (ROUTINE TESTING W REFLEX): HIV Screen 4th Generation wRfx: NONREACTIVE

## 2022-06-18 SURGERY — LEFT HEART CATH AND CORONARY ANGIOGRAPHY
Anesthesia: LOCAL

## 2022-06-18 MED ORDER — HYDROCODONE-ACETAMINOPHEN 5-325 MG PO TABS
1.0000 | ORAL_TABLET | Freq: Four times a day (QID) | ORAL | Status: DC | PRN
  Filled 2022-06-18: qty 1

## 2022-06-18 MED ORDER — ACETAMINOPHEN 325 MG PO TABS: 650.0000 mg | ORAL_TABLET | ORAL | Status: DC | PRN

## 2022-06-18 MED ORDER — ASPIRIN 81 MG PO TBEC
81.0000 mg | DELAYED_RELEASE_TABLET | Freq: Every day | ORAL | Status: DC
  Administered 2022-06-18 – 2022-06-19 (×2): 81 mg via ORAL
  Filled 2022-06-18 (×2): qty 1

## 2022-06-18 MED ORDER — FLUTICASONE FUROATE-VILANTEROL 100-25 MCG/ACT IN AEPB
1.0000 | INHALATION_SPRAY | Freq: Every day | RESPIRATORY_TRACT | Status: DC
  Filled 2022-06-18: qty 28

## 2022-06-18 MED ORDER — SODIUM CHLORIDE 0.9 % IV SOLN: INTRAVENOUS | Status: AC

## 2022-06-18 MED ORDER — SODIUM CHLORIDE 0.9 % IV SOLN: INTRAVENOUS | Status: DC

## 2022-06-18 MED ORDER — POTASSIUM CHLORIDE CRYS ER 20 MEQ PO TBCR
40.0000 meq | EXTENDED_RELEASE_TABLET | Freq: Once | ORAL | Status: AC
  Administered 2022-06-18: 40 meq via ORAL
  Filled 2022-06-18: qty 2

## 2022-06-18 MED ORDER — ALPRAZOLAM 0.25 MG PO TABS
0.2500 mg | ORAL_TABLET | Freq: Three times a day (TID) | ORAL | Status: DC | PRN
  Administered 2022-06-18: 0.25 mg via ORAL
  Filled 2022-06-18: qty 1

## 2022-06-18 MED ORDER — LIDOCAINE 5 % EX PTCH
1.0000 | MEDICATED_PATCH | CUTANEOUS | Status: DC
  Administered 2022-06-18: 1 via TRANSDERMAL
  Filled 2022-06-18: qty 1

## 2022-06-18 MED ORDER — SODIUM CHLORIDE 0.9 % IV BOLUS (SEPSIS)
1000.0000 mL | Freq: Once | INTRAVENOUS | Status: AC
  Administered 2022-06-18: 1000 mL via INTRAVENOUS

## 2022-06-18 MED ORDER — UMECLIDINIUM BROMIDE 62.5 MCG/ACT IN AEPB
1.0000 | INHALATION_SPRAY | Freq: Every day | RESPIRATORY_TRACT | Status: DC
  Filled 2022-06-18: qty 7

## 2022-06-18 MED ORDER — ZINC SULFATE 220 (50 ZN) MG PO CAPS
220.0000 mg | ORAL_CAPSULE | Freq: Every day | ORAL | Status: DC
  Administered 2022-06-18 – 2022-06-19 (×2): 220 mg via ORAL
  Filled 2022-06-18 (×2): qty 1

## 2022-06-18 MED ORDER — SERTRALINE HCL 50 MG PO TABS
25.0000 mg | ORAL_TABLET | Freq: Every day | ORAL | Status: DC
  Administered 2022-06-18 – 2022-06-19 (×2): 25 mg via ORAL
  Filled 2022-06-18 (×2): qty 1

## 2022-06-18 MED ORDER — AMLODIPINE BESYLATE 5 MG PO TABS
10.0000 mg | ORAL_TABLET | Freq: Every day | ORAL | Status: DC
  Administered 2022-06-18 – 2022-06-19 (×2): 10 mg via ORAL
  Filled 2022-06-18 (×2): qty 2

## 2022-06-18 MED ORDER — HYDROCHLOROTHIAZIDE 12.5 MG PO TABS
12.5000 mg | ORAL_TABLET | Freq: Every day | ORAL | Status: DC
  Administered 2022-06-18 – 2022-06-19 (×2): 12.5 mg via ORAL
  Filled 2022-06-18 (×2): qty 1

## 2022-06-18 MED ORDER — ALBUTEROL SULFATE (2.5 MG/3ML) 0.083% IN NEBU: 2.5000 mg | INHALATION_SOLUTION | Freq: Four times a day (QID) | RESPIRATORY_TRACT | Status: DC | PRN

## 2022-06-18 MED ORDER — BUDESON-GLYCOPYRROL-FORMOTEROL 160-9-4.8 MCG/ACT IN AERO: 1.0000 | INHALATION_SPRAY | Freq: Every day | RESPIRATORY_TRACT | Status: DC | PRN

## 2022-06-18 MED ORDER — ONDANSETRON HCL 4 MG/2ML IJ SOLN: 4.0000 mg | Freq: Four times a day (QID) | INTRAMUSCULAR | Status: DC | PRN

## 2022-06-18 NOTE — ED Provider Notes (Signed)
[facility] EMERGENCY DEPARTMENT Provider Note   CSN: [id number] Arrival date & time: 06/18/22  0310     History  Chief Complaint  Patient presents with   Loss of Consciousness   Level 5 caveat due to acuity of condition Casey [name] is a 51 y.o. male.  The history is provided by the patient and the EMS personnel. The history is limited by the condition of the patient.  Patient with history of hypertension presents after syncope and chest pain.  Patient reports he got up to go the bathroom and had a syncopal episode.  He also reports nausea and chest tightness.  He also reported episode of chest tightness the day before but thought it was anxiety  EMS reports on their arrival patient only reported syncope and later described chest tightness.  Prehospital EKG was concerning for ST elevation and patient was treated with aspirin and nitroglycerin.   On arrival to the room patient was bradycardic to 30s-40s and unresponsive for a brief period of time.  Home Medications Prior to Admission medications   Medication Sig Start Date End Date Taking? Authorizing Provider  albuterol (VENTOLIN HFA) 108 (90 Base) MCG/ACT inhaler Inhale 2 puffs into the lungs every 6 (six) hours as needed for wheezing or shortness of breath. 07/25/20  Yes Ivonne Andrew, NP  amLODipine (NORVASC) 5 MG tablet Take 5 mg by mouth daily.   Yes [provider]  Budeson-Glycopyrrol-Formoterol (BREZTRI AEROSPHERE) 160-9-4.8 MCG/ACT AERO Inhale 1 puff into the lungs daily as needed (for shortness of breath and cough).   Yes [provider]  hydrochlorothiazide (MICROZIDE) 12.5 MG capsule Take 12.5 mg by mouth daily.   Yes [provider]  sertraline (ZOLOFT) 25 MG tablet Take 25 mg by mouth daily. 06/17/22  Yes [provider]  zinc gluconate 50 MG tablet Take 50 mg by mouth daily.   Yes [provider]  metoprolol tartrate (LOPRESSOR) 50 MG tablet Take 1  tablet (50 mg total) by mouth as directed. Take 1 tablet 2 hours before your CT scan Patient not taking: Reported on 06/18/2022 05/07/22   Jake Bathe, MD      Allergies    Other and Zegerid [omeprazole]    Review of Systems   Review of Systems  Unable to perform ROS: Acuity of condition    Physical Exam Updated Vital Signs BP 134/80   Pulse 78   Temp 98.6 F (37 C) (Oral)   Resp 15   Ht 1.803 m (5\' 11" )   Wt 93 kg   SpO2 97%   BMI 28.59 kg/m  Physical Exam CONSTITUTIONAL: Ill-appearing HEAD: Abrasions to forehead EYES: EOMI/PERRL ENMT: Mucous membranes moist NECK: supple no meningeal signs SPINE/BACK: Diffuse cervical tenderness No bruising/crepitance/stepoffs noted to spine Patient maintained in spinal precautions/logroll utilized CV: S1/S2 noted, no murmurs/rubs/gallops noted LUNGS: Lungs are clear to auscultation bilaterally, no apparent distress ABDOMEN: soft, nontender GU:no cva tenderness NEURO: Pt is resting with eyes closed but wakes up and answers all questions appropriately No arm or leg drift.  No obvious facial droop EXTREMITIES: pulses normal/equalx4, full ROM SKIN: warm, color normal PSYCH: Unable to assess  ED Results / Procedures / Treatments   Labs (all labs ordered are listed, but only abnormal results are displayed) Labs Reviewed  CBC WITH DIFFERENTIAL/PLATELET - Abnormal; Notable for the following components:      Result Value   MCHC 36.3 (*)    All other components within normal limits  APTT - Abnormal; Notable for the following components:   aPTT 23 (*)    All other components within normal limits  COMPREHENSIVE METABOLIC PANEL - Abnormal; Notable for the following components:   Sodium 133 (*)    Potassium 3.3 (*)    Chloride 96 (*)    Glucose, Bld 162 (*)    Creatinine, Ser 1.34 (*)    Total Bilirubin 2.4 (*)    All other components within normal limits  LIPID PANEL - Abnormal; Notable for the following components:   HDL 26 (*)     LDL Cholesterol 138 (*)    All other components within normal limits  RESP PANEL BY RT-PCR (FLU A&B, COVID) ARPGX2  HEMOGLOBIN A1C  PROTIME-INR  RAPID URINE DRUG SCREEN, HOSP PERFORMED  ETHANOL  TROPONIN I (HIGH SENSITIVITY)  TROPONIN I (HIGH SENSITIVITY)    EKG EKG Interpretation  Date/Time:  Thursday June 18 2022 05:02:51 EDT Ventricular Rate:  86 PR Interval:  198 QRS Duration: 86 QT Interval:  359 QTC Calculation: 430 R Axis:   71 Text Interpretation: Sinus rhythm RSR' in V1 or V2, probably normal variant ST elev, probable normal early repol pattern No significant change since last tracing Confirmed by Zadie Rhine ([id number]) on 06/18/2022 5:04:42 AM  Radiology DG Chest Port 1 View  Result Date: 06/18/2022 CLINICAL DATA:  Weakness. EXAM: PORTABLE CHEST 1 VIEW COMPARISON:  04/30/2022. FINDINGS: The heart size and mediastinal contours are within normal limits. Both lungs are clear. No acute osseous abnormality. IMPRESSION: No active disease. Electronically Signed   By: Thornell Sartorius M.D.   On: 06/18/2022 04:27   CT Cervical Spine Wo Contrast  Result Date: 06/18/2022 CLINICAL DATA:  Loss of consciousness. EXAM: CT CERVICAL SPINE WITHOUT CONTRAST TECHNIQUE: Multidetector CT imaging of the cervical spine was performed without intravenous contrast. Multiplanar CT image reconstructions were also generated. RADIATION DOSE REDUCTION: This exam was performed according to the departmental dose-optimization program which includes automated exposure control, adjustment of the mA and/or kV according to patient size and/or use of iterative reconstruction technique. COMPARISON:  None Available. FINDINGS: Alignment: There is straightening of the normal cervical spine lordosis. Skull base and vertebrae: No acute fracture. Chronic degenerative changes seen along the tip of the dens and adjacent portion of the anterior arch of C1. Soft tissues and spinal canal: No prevertebral fluid or  swelling. No visible canal hematoma. Disc levels: Mild anterior osteophyte formation is seen at the levels of C6-C7 and C7-T1. Normal intervertebral disc spaces are noted throughout the cervical spine. Bilateral mild-to-moderate severity multilevel facet joint hypertrophy is noted. Upper chest: Negative. Other: None. IMPRESSION: 1. No acute fracture or subluxation in the cervical spine. 2. Mild degenerative changes at the levels of C6-C7 and C7-T1. Electronically Signed   By: Aram Candela M.D.   On: 06/18/2022 03:52   CT Head Wo Contrast  Result Date: 06/18/2022 CLINICAL DATA:  Loss of consciousness. EXAM: CT HEAD WITHOUT CONTRAST TECHNIQUE: Contiguous axial images were obtained from the base of the skull through the vertex without intravenous contrast. RADIATION DOSE REDUCTION: This exam was performed according to the departmental dose-optimization program which includes automated exposure control, adjustment of the mA and/or kV according to patient size and/or use of iterative reconstruction technique. COMPARISON:  None Available. FINDINGS: Brain: No evidence of acute infarction, hemorrhage, hydrocephalus, extra-axial collection or mass lesion/mass effect. Vascular: No hyperdense vessel or unexpected calcification. Skull: Normal. Negative for fracture or focal lesion. Sinuses/Orbits: There is moderate severity right ethmoid sinus  mucosal thickening. Other: None. IMPRESSION: 1. No acute intracranial abnormality. 2. Moderate severity right ethmoid sinus disease. Electronically Signed   By: Aram Candela M.D.   On: 06/18/2022 03:50    Procedures .Critical Care  Performed by: Zadie Rhine, MD Authorized by: Zadie Rhine, MD   Critical care provider statement:    Critical care time (minutes):  45   Critical care start time:  06/18/2022 3:45 AM   Critical care end time:  06/18/2022 4:30 AM   Critical care time was exclusive of:  Separately billable procedures and treating other patients    Critical care was necessary to treat or prevent imminent or life-threatening deterioration of the following conditions:  Cardiac failure, circulatory failure and CNS failure or compromise   Critical care was time spent personally by me on the following activities:  Discussions with consultants, examination of patient, evaluation of patient's response to treatment, pulse oximetry, re-evaluation of patient's condition, ordering and review of radiographic studies, ordering and review of laboratory studies, ordering and performing treatments and interventions and obtaining history from patient or surrogate   I assumed direction of critical care for this patient from another provider in my specialty: no     Care discussed with: admitting provider       Medications Ordered in ED Medications  sodium chloride 0.9 % bolus 1,000 mL (1,000 mLs Intravenous New Bag/Given (Non-Interop) 06/18/22 0431)    ED Course/ Medical Decision Making/ A&P Clinical Course as of 06/18/22 0602  Thu Jun 18, 2022  0332 I was called to the bedside because patient was having episodes of bradycardia and unresponsiveness.  On my arrival to room patient was resting with his eyes closed, heart rate was in the 40s and improving.  Patient now reports he is having chest tightness. [DW]  [phone] EMS reports the initial call was for a fall/syncope.  After taking the hospital he started reporting chest tightness and they gave him aspirin and nitroglycerin. [DW]  0335   Prehospital EKG does reveal evidence of elevation, EKG in the ER does reveal evidence of anterior lateral ST elevation.  Due to reported chest tightness, now having episodes of bradycardia and reported syncope, code STEMI has been paged [DW]  [phone] Patient does report having headache and neck pain after his syncopal episode.  Plan to obtain CT imaging head and neck while awaiting cardiology [DW]  [phone] Discussed with Dr. Clifton James.  He is reviewed imaging.  We will cancel code STEMI  for now, but will have cardiology consult to determine cause of syncope and chest pain. [DW]  [phone] Patient stable, feels improved, troponin negative, CT imaging negative thus far [DW]  0436 Glucose(!): 162 Hyperglycemia [DW]  0504 Pt reporting episode of CP, no EKG changes, cardiology paged [DW]  0522 Discussed with cardiology fellow Dr. Hyacinth Meeker.  He recommends medical admission [DW]    Clinical Course User Index [DW] Zadie Rhine, MD                           Medical Decision Making Amount and/or Complexity of Data Reviewed Labs: ordered. Decision-making details documented in ED Course. Radiology: ordered. ECG/medicine tests: ordered.  Risk Prescription drug management. Decision regarding hospitalization.   This patient presents to the ED for concern of chest pain and syncope, this involves an extensive number of treatment options, and is a complaint that carries with it a high risk of complications and morbidity.  The differential diagnosis includes but is  not limited to acute coronary syndrome, aortic dissection, pulmonary embolism, pericarditis, pneumothorax, pneumonia, myocarditis, pleurisy, esophageal rupture    Comorbidities that complicate the patient evaluation: Patient's presentation is complicated by their history of hypertension  Social Determinants of Health: Social history unknown  Additional history obtained: Additional history obtained from EMS  Records reviewed  outpatient cardiology notes reviewed  Lab Tests: I Ordered, and personally interpreted labs.  The pertinent results include: Hyperglycemia  Imaging Studies ordered: I ordered imaging studies including CT scan head and C-spine and X-ray chest   I independently visualized and interpreted imaging which showed no acute findings I agree with the radiologist interpretation  Cardiac Monitoring: The patient was maintained on a cardiac monitor.  I personally viewed and interpreted the cardiac monitor  which showed an underlying rhythm of:  sinus rhythm    Critical Interventions:  Admission and monitoring  Consultations Obtained: I requested consultation with the consultant cardiology , and discussed  findings as well as pertinent plan - they recommend: Admit to medicine Discussed with Dr. Loney Loh for admission  Reevaluation: After the interventions noted above, I reevaluated the patient and found that they have :improved  Complexity of problems addressed: Patient's presentation is most consistent with  acute presentation with potential threat to life or bodily function  Disposition: After consideration of the diagnostic results and the patient's response to treatment,  I feel that the patent would benefit from admission   .           Final Clinical Impression(s) / ED Diagnoses Final diagnoses:  Syncope and collapse    Rx / DC Orders ED Discharge Orders     None         Zadie Rhine, MD 06/18/22 [phone]

## 2022-06-18 NOTE — Progress Notes (Signed)
Cardiology Note:   See full Cardiology Consult note from 5:54 am.  I have examined the patient this morning.  He currently feels fatigue. He notes poor poor intake for several weeks, stress and anxiety. He was recently started on new medications for his blood pressure and was also started on Zoloft.  Syncope last night when standing to walk to the bathroom.  On arrival to [facility], he had a bradycardic episode and was reportedly unresponsive but quickly woke up when the staff arrived in his room.  Code STEMI activated by the ED but cancelled after I reviewed the EKG with the ED physician. He has chronic anterolateral ST abnormality, unchanged.  Troponin negative x 2. Tele this am with sinus at rate of 80 bpm  Plan: Agree with echo today. Follow on telemetry. It is unclear if his event was related to hypovolemia/dehydration and addition of new medications at home. Would not restart metoprolol We will follow with you.   Verne Carrow 06/18/2022 8:10 AM

## 2022-06-18 NOTE — ED Notes (Signed)
Report given to Dana RN

## 2022-06-18 NOTE — Consult Note (Signed)
Cardiology Consultation:   Patient ID: Casey [name] MRN: [id number]; DOB: 1971-03-11  Admit date: 06/18/2022 Date of Consult: 06/18/2022  Primary Care Provider: Renford Dills, MD Primary Cardiologist: Casey Schultz, MD  Primary Electrophysiologist:  None    Patient Profile:   Casey [name] is a 51 y.o. male with a hx of HTN, depression who is being seen today for the evaluation of episodes of syncope and bradycardia at the request of emergency department.  History of Present Illness:   Casey [name] is a 51 y.o. male with a hx of HTN, depression who is brought in after several episodes of loss of consciousness. They were witnessed by wife and here in emergency department. Notes that his HR drops in the 30s on telemetry and he becomes unresponsive. Prior to the episode he complains that he is having bad headache or nauseaous. Wife notes that he was confused after episode at home.  He has been having intermittent chest pain but states this is not new pain, he has had before and ongoing. Feels that is somewhat worse now.  Recently started on meds for BP and sertraline  Past Medical History:  Diagnosis Date   Bronchitis    Chest pain on exertion    COVID-19    Hyperlipidemia    Obesity    Pneumonia    Respiratory failure (HCC)    SOB (shortness of breath)     Past Surgical History:  Procedure Laterality Date   APPENDECTOMY     SKIN GRAFT       Home Medications:  Prior to Admission medications   Medication Sig Start Date End Date Taking? Authorizing Provider  albuterol (VENTOLIN HFA) 108 (90 Base) MCG/ACT inhaler Inhale 2 puffs into the lungs every 6 (six) hours as needed for wheezing or shortness of breath. 07/25/20  Yes Ivonne Andrew, NP  amLODipine (NORVASC) 5 MG tablet Take 5 mg by mouth daily.   Yes [provider]  Budeson-Glycopyrrol-Formoterol (BREZTRI AEROSPHERE) 160-9-4.8 MCG/ACT AERO Inhale 1 puff into the lungs daily as needed (for shortness of breath  and cough).   Yes [provider]  hydrochlorothiazide (MICROZIDE) 12.5 MG capsule Take 12.5 mg by mouth daily.   Yes [provider]  sertraline (ZOLOFT) 25 MG tablet Take 25 mg by mouth daily. 06/17/22  Yes [provider]  zinc gluconate 50 MG tablet Take 50 mg by mouth daily.   Yes [provider]  metoprolol tartrate (LOPRESSOR) 50 MG tablet Take 1 tablet (50 mg total) by mouth as directed. Take 1 tablet 2 hours before your CT scan Patient not taking: Reported on 06/18/2022 05/07/22   Jake Bathe, MD    Inpatient Medications: Scheduled Meds:  Continuous Infusions:  PRN Meds:   Allergies:    Allergies  Allergen Reactions   Other     KAPIDEX STOMACH PAIN   Zegerid [Omeprazole]     BURNING IN THE CHEST    Social History:   Social History   Socioeconomic History   Marital status: Married    Spouse name: Not on file   Number of children: Not on file   Years of education: Not on file   Highest education level: Not on file  Occupational History   Not on file  Tobacco Use   Smoking status: Former   Smokeless tobacco: Never  Substance and Sexual Activity   Alcohol use: No   Drug use: Never   Sexual activity: Not on file  Other Topics Concern  Not on file  Social History Narrative   Not on file   Social Determinants of Health   Financial Resource Strain: Not on file  Food Insecurity: Not on file  Transportation Needs: Not on file  Physical Activity: Not on file  Stress: Not on file  Social Connections: Not on file  Intimate Partner Violence: Not on file    Family History:   No family history on file.   Review of Systems: [y] = yes, [ ]  = no    General: Weight gain [ ] ; Weight loss [ ] ; Anorexia [ ] ; Fatigue [ ] ; Fever [ ] ; Chills [ ] ; Weakness [ ]   Cardiac: Chest pain/pressure [ y]; Resting SOB [ ] ; Exertional SOB [ ] ; Orthopnea [ ] ; Pedal Edema [ ] ; Palpitations [ ] ; Syncope [ y]; Presyncope [ ] ; Paroxysmal nocturnal  dyspnea[ ]   Pulmonary: Cough [ ] ; Wheezing[ ] ; Hemoptysis[ ] ; Sputum [ ] ; Snoring [ ]   GI: Vomiting[ ] ; Dysphagia[ ] ; Melena[ ] ; Hematochezia [ ] ; Heartburn[ ] ; Abdominal pain [ ] ; Constipation [ ] ; Diarrhea [ ] ; BRBPR [ ]   GU: Hematuria[ ] ; Dysuria [ ] ; Nocturia[ ]   Vascular: Pain in legs with walking [ ] ; Pain in feet with lying flat [ ] ; Non-healing sores [ ] ; Stroke [ ] ; TIA [ ] ; Slurred speech [ ] ;  Neuro: Headaches[y ]; Vertigo[ ] ; Seizures[ ] ; Paresthesias[ ] ;Blurred vision [ ] ; Diplopia [ ] ; Vision changes [ ]   Ortho/Skin: Arthritis [ ] ; Joint pain [ ] ; Muscle pain [ ] ; Joint swelling [ ] ; Back Pain [ ] ; Rash [ ]   Psych: Depression[ ] ; Anxiety[ ]   Heme: Bleeding problems [ ] ; Clotting disorders [ ] ; Anemia [ ]   Endocrine: Diabetes [ ] ; Thyroid dysfunction[ ]   Physical Exam/Data:   Vitals:   06/18/22 0343 06/18/22 0412 06/18/22 0519 06/18/22 0525  BP: 130/86 128/85 126/83   Pulse: 91 79 81   Resp:  14 15   Temp:  98.6 F (37 C)  98.6 F (37 C)  TempSrc:  Oral  Oral  SpO2: 98% 98% 95%    No intake or output data in the 24 hours ending 06/18/22 0526 There were no vitals filed for this visit. There is no height or weight on file to calculate BMI.  General: no distress but appears tired/flat HEENT: normal Lymph: no adenopathy Neck: no JVD Endocrine:  No thryomegaly Vascular: No carotid bruits; FA pulses 2+ bilaterally without bruits  Cardiac:  normal S1, S2; RRR; no murmur  Lungs:  clear to auscultation bilaterally, no wheezing, rhonchi or rales  Abd: soft, nontender, no hepatomegaly  Ext: no edema Musculoskeletal:  No deformities, BUE and BLE strength normal and equal Skin: warm and dry  Neuro:  CNs 2-12 intact, no focal abnormalities noted Psych:  Normal affect   EKG:  The EKG was personally reviewed and demonstrates:  J poiint elevation that was present on prior ECG, no acute changes  Telemetry:  Telemetry was personally reviewed and demonstrates:  normal sinus rhythm  with bradycardia during episodes of syncope   Relevant CV Studies: none  Laboratory Data:  Chemistry Recent Labs  Lab 06/18/22 0320  NA 133*  K 3.3*  CL 96*  CO2 24  GLUCOSE 162*  BUN 13  CREATININE 1.34*  CALCIUM 9.6  GFRNONAA >60  ANIONGAP 13    Recent Labs  Lab 06/18/22 0320  PROT 7.7  ALBUMIN 4.5  AST 23  ALT 23  ALKPHOS 81  BILITOT 2.4*  Hematology Recent Labs  Lab 06/18/22 0320  WBC 10.1  RBC 5.44  HGB 17.0  HCT 46.8  MCV 86.0  MCH 31.3  MCHC 36.3*  RDW 12.3  PLT 241   Cardiac EnzymesNo results for input(s): "TROPONINI" in the last 168 hours. No results for input(s): "TROPIPOC" in the last 168 hours.  BNPNo results for input(s): "BNP", "PROBNP" in the last 168 hours.  DDimer No results for input(s): "DDIMER" in the last 168 hours.  Radiology/Studies:  DG Chest Port 1 View  Result Date: 06/18/2022 CLINICAL DATA:  Weakness. EXAM: PORTABLE CHEST 1 VIEW COMPARISON:  04/30/2022. FINDINGS: The heart size and mediastinal contours are within normal limits. Both lungs are clear. No acute osseous abnormality. IMPRESSION: No active disease. Electronically Signed   By: Thornell Sartorius M.D.   On: 06/18/2022 04:27   CT Cervical Spine Wo Contrast  Result Date: 06/18/2022 CLINICAL DATA:  Loss of consciousness. EXAM: CT CERVICAL SPINE WITHOUT CONTRAST TECHNIQUE: Multidetector CT imaging of the cervical spine was performed without intravenous contrast. Multiplanar CT image reconstructions were also generated. RADIATION DOSE REDUCTION: This exam was performed according to the departmental dose-optimization program which includes automated exposure control, adjustment of the mA and/or kV according to patient size and/or use of iterative reconstruction technique. COMPARISON:  None Available. FINDINGS: Alignment: There is straightening of the normal cervical spine lordosis. Skull base and vertebrae: No acute fracture. Chronic degenerative changes seen along the tip of the dens  and adjacent portion of the anterior arch of C1. Soft tissues and spinal canal: No prevertebral fluid or swelling. No visible canal hematoma. Disc levels: Mild anterior osteophyte formation is seen at the levels of C6-C7 and C7-T1. Normal intervertebral disc spaces are noted throughout the cervical spine. Bilateral mild-to-moderate severity multilevel facet joint hypertrophy is noted. Upper chest: Negative. Other: None. IMPRESSION: 1. No acute fracture or subluxation in the cervical spine. 2. Mild degenerative changes at the levels of C6-C7 and C7-T1. Electronically Signed   By: Aram Candela M.D.   On: 06/18/2022 03:52   CT Head Wo Contrast  Result Date: 06/18/2022 CLINICAL DATA:  Loss of consciousness. EXAM: CT HEAD WITHOUT CONTRAST TECHNIQUE: Contiguous axial images were obtained from the base of the skull through the vertex without intravenous contrast. RADIATION DOSE REDUCTION: This exam was performed according to the departmental dose-optimization program which includes automated exposure control, adjustment of the mA and/or kV according to patient size and/or use of iterative reconstruction technique. COMPARISON:  None Available. FINDINGS: Brain: No evidence of acute infarction, hemorrhage, hydrocephalus, extra-axial collection or mass lesion/mass effect. Vascular: No hyperdense vessel or unexpected calcification. Skull: Normal. Negative for fracture or focal lesion. Sinuses/Orbits: There is moderate severity right ethmoid sinus mucosal thickening. Other: None. IMPRESSION: 1. No acute intracranial abnormality. 2. Moderate severity right ethmoid sinus disease. Electronically Signed   By: Aram Candela M.D.   On: 06/18/2022 03:50    Assessment and Plan:   Bradycardia and syncopal episodes  Unclear what the trigger is for these episodes. I am not convinced that this is coronary ischemia causing these events. First troponin is normal and chest pain has been chronic. He should get an echo to rule  out any structural or valvular abnormalities. Would recommend admission to medicine for thorough evaluation and we can have our EP colleauges review the tele of his brady events for their input later today.   - admit to medicine  - repeat troponin x 2 to complete ishcemic eval - order echo -  correct electrolytes  - we will ask EP to review tele event        For questions or updates, please contact [facility] Please consult [email or web address] for contact info under     Signed, Joellen Jersey, MD  06/18/2022 5:26 AM

## 2022-06-18 NOTE — Progress Notes (Signed)
  Echocardiogram 2D Echocardiogram has been performed.  Casey [name] 06/18/2022, 11:55 AM

## 2022-06-18 NOTE — ED Triage Notes (Addendum)
Pt arrived via [facility] from home with c/c of Fall and Abnormal EKG. Per EMS pt had syncopal episode, where he states hit strike head. Pt had positive LOC, and stated last thing he remember was he felt nausea and went to bathroom. Pt has been on new diet where he doesn't eat meat in which he start this past Saturday. Pt seen yesterday for anxiety where he was started on new medication sertraline and took first does before bed. Pt endorse chest tightness prior to syncopal episode. EMS states when they got pt on monitor pt has V lead elevation.   324 ASA, 1 Nitro 163/85, 86HR, 99% RA

## 2022-06-18 NOTE — Progress Notes (Signed)
   06/18/22 0329  Clinical Encounter Type  Visited With Patient not available;Health care provider Marland KitchenTacey Heap, RN)  Visit Type ED;Code;Initial  Referral From Nurse Marland KitchenTacey Heap, RN)  Consult/Referral To Chaplain Benetta Spar)  Recommendations CODE STEMI - Cancelled   Chaplain paged to E.D. Room 34 for Code STEMI. Stemi Cancelled by Dr. Sanjuana Kava. Patient not available. Spiritual Care services not necessary at this time. Staff will page as needs arise. [address] [place], Aletha Halim., [phone]

## 2022-06-18 NOTE — ED Notes (Signed)
Pt had a 40 sec to 1 min episode of bradycardia at 32BPM and became unresponsive. Pt placed on cardiac pads and doctor called to room.  HR returned to 80 and pt regained consciousness.

## 2022-06-18 NOTE — ED Notes (Signed)
Called lab for add on TSH at this time.

## 2022-06-18 NOTE — ED Notes (Addendum)
Pt went unresponsive and brady, RN Harvel Quale and Bebe Shaggy, MD at bedside.

## 2022-06-18 NOTE — ED Notes (Signed)
Pt states that his neck pain and chest tightness returned. 12 lead taken and provider notified. No changes to VS noted

## 2022-06-18 NOTE — ED Notes (Signed)
Patient resting in bed, denies any chest pain. NAD at the moment. Sitting on edge of bed.

## 2022-06-18 NOTE — H&P (Signed)
History and Physical    Casey [name] JJK:[id number] DOB: [birth year] DOA: 06/18/2022  PCP: Renford Dills, MD (Confirm with patient/family/NH records and if not entered, this has to be entered at [facility] point of entry) Patient coming from: Home  I have personally briefly reviewed patient's old medical records in [facility]  Chief Complaint: Chest pain  HPI: Casey [name] is a 51 y.o. male with medical history significant of HTN, mild intermittent asthma, anxiety/depression, presented with chest pain and syncope.  Patient first developed symptoms of chest pain back in July came to ED, ACS ruled out and patient discharged home.  Subsequently, patient follow-up with cardiology in the office on August 3, when it was found the patient had elevated blood pressure despite already on amlodipine, and admitted, metoprolol was added and patient is to schedule for outpatient coronary artery CT angiogram.  Patient reported he has been facing a lot of stress in his life and was started on Zoloft this week.  Then, last 3 days, patient started to have episode of pressure-like chest pain centrally located, on and off, not related to activity and he did not take any medication and just let the episode pass by itself.  Denies any associated symptoms.  Last night, patient went to bathroom and had a syncope and fell down, episode lasted less than 1 minute and patient recovered consciousness by himself.  He did remember feeling nausea and lightheaded before the episode.  EMS arrived and found the patient blood pressure elevated 160/85, and patient was given nitro x1 and ASA 324x1. ED Course: Vital signs stable, no tachycardia no hypotension.  CT head and cervical spine negative for acute findings.  Troponin negative x2, EKG no significant ST-T changes.  Review of Systems: As per HPI otherwise 14 point review of systems negative.    Past Medical History:  Diagnosis Date   Bronchitis    Chest pain on exertion     COVID-19    Hyperlipidemia    Obesity    Pneumonia    Respiratory failure (HCC)    SOB (shortness of breath)     Past Surgical History:  Procedure Laterality Date   APPENDECTOMY     SKIN GRAFT       reports that he has quit smoking. He has never used smokeless tobacco. He reports that he does not drink alcohol and does not use drugs.  Allergies  Allergen Reactions   Other     KAPIDEX STOMACH PAIN   Zegerid [Omeprazole]     BURNING IN THE CHEST    No family history on file.   Prior to Admission medications   Medication Sig Start Date End Date Taking? Authorizing Provider  albuterol (VENTOLIN HFA) 108 (90 Base) MCG/ACT inhaler Inhale 2 puffs into the lungs every 6 (six) hours as needed for wheezing or shortness of breath. 07/25/20  Yes Ivonne Andrew, NP  amLODipine (NORVASC) 5 MG tablet Take 5 mg by mouth daily.   Yes [provider]  Budeson-Glycopyrrol-Formoterol (BREZTRI AEROSPHERE) 160-9-4.8 MCG/ACT AERO Inhale 1 puff into the lungs daily as needed (for shortness of breath and cough).   Yes [provider]  hydrochlorothiazide (MICROZIDE) 12.5 MG capsule Take 12.5 mg by mouth daily.   Yes [provider]  sertraline (ZOLOFT) 25 MG tablet Take 25 mg by mouth daily. 06/17/22  Yes [provider]  zinc gluconate 50 MG tablet Take 50 mg by mouth daily.   Yes [provider]  metoprolol  tartrate (LOPRESSOR) 50 MG tablet Take 1 tablet (50 mg total) by mouth as directed. Take 1 tablet 2 hours before your CT scan Patient not taking: Reported on 06/18/2022 05/07/22   Jake Bathe, MD    Physical Exam: Vitals:   06/18/22 0544 06/18/22 0549 06/18/22 0700 06/18/22 0730  BP:  134/80 (!) 140/90   Pulse:  78 92   Resp:  15 15   Temp:      TempSrc:      SpO2:  97% 96% 98%  Weight: 93 kg     Height: 5\' 11"  (1.803 m)       Constitutional: NAD, calm, comfortable Vitals:   06/18/22 0544 06/18/22 0549 06/18/22 0700 06/18/22 0730  BP:   134/80 (!) 140/90   Pulse:  78 92   Resp:  15 15   Temp:      TempSrc:      SpO2:  97% 96% 98%  Weight: 93 kg     Height: 5\' 11"  (1.803 m)      Eyes: PERRL, lids and conjunctivae normal ENMT: Mucous membranes are moist. Posterior pharynx clear of any exudate or lesions.Normal dentition.  Neck: normal, supple, no masses, no thyromegaly Respiratory: clear to auscultation bilaterally, no wheezing, no crackles. Normal respiratory effort. No accessory muscle use.  Cardiovascular: Regular rate and rhythm, no murmurs / rubs / gallops. No extremity edema. 2+ pedal pulses. No carotid bruits.  Abdomen: no tenderness, no masses palpated. No hepatosplenomegaly. Bowel sounds positive.  Musculoskeletal: no clubbing / cyanosis. No joint deformity upper and lower extremities. Good ROM, no contractures. Normal muscle tone.  Skin: no rashes, lesions, ulcers. No induration Neurologic: CN 2-12 grossly intact. Sensation intact, DTR normal. Strength 5/5 in all 4.  Psychiatric: Normal judgment and insight. Alert and oriented x 3. Normal mood.     Labs on Admission: I have personally reviewed following labs and imaging studies  CBC: Recent Labs  Lab 06/18/22 0320  WBC 10.1  NEUTROABS 6.6  HGB 17.0  HCT 46.8  MCV 86.0  PLT 241   Basic Metabolic Panel: Recent Labs  Lab 06/18/22 0320  NA 133*  K 3.3*  CL 96*  CO2 24  GLUCOSE 162*  BUN 13  CREATININE 1.34*  CALCIUM 9.6   GFR: Estimated Creatinine Clearance: 76 mL/min (A) (by C-G formula based on SCr of 1.34 mg/dL (H)). Liver Function Tests: Recent Labs  Lab 06/18/22 0320  AST 23  ALT 23  ALKPHOS 81  BILITOT 2.4*  PROT 7.7  ALBUMIN 4.5   No results for input(s): "LIPASE", "AMYLASE" in the last 168 hours. No results for input(s): "AMMONIA" in the last 168 hours. Coagulation Profile: Recent Labs  Lab 06/18/22 0320  INR 1.2   Cardiac Enzymes: No results for input(s): "CKTOTAL", "CKMB", "CKMBINDEX", "TROPONINI" in the last 168  hours. BNP (last 3 results) No results for input(s): "PROBNP" in the last 8760 hours. HbA1C: Recent Labs    06/18/22 0320  HGBA1C 5.4   CBG: No results for input(s): "GLUCAP" in the last 168 hours. Lipid Profile: Recent Labs    06/18/22 0320  CHOL 174  HDL 26*  LDLCALC 138*  TRIG 48  CHOLHDL 6.7   Thyroid Function Tests: No results for input(s): "TSH", "T4TOTAL", "FREET4", "T3FREE", "THYROIDAB" in the last 72 hours. Anemia Panel: No results for input(s): "VITAMINB12", "FOLATE", "FERRITIN", "TIBC", "IRON", "RETICCTPCT" in the last 72 hours. Urine analysis:    Component Value Date/Time   COLORURINE YELLOW 05/26/2020 0730  APPEARANCEUR CLEAR 05/26/2020 0730   LABSPEC 1.013 05/26/2020 0730   LABSPEC 1.010 11/15/2013 1411   PHURINE 6.0 05/26/2020 0730   GLUCOSEU NEGATIVE 05/26/2020 0730   GLUCOSEU NEGATIVE 11/15/2013 1411   HGBUR NEGATIVE 05/26/2020 0730   BILIRUBINUR NEGATIVE 05/26/2020 0730   KETONESUR 5 (A) 05/26/2020 0730   PROTEINUR NEGATIVE 05/26/2020 0730   NITRITE NEGATIVE 05/26/2020 0730   LEUKOCYTESUR NEGATIVE 05/26/2020 0730    Radiological Exams on Admission: DG Chest Port 1 View  Result Date: 06/18/2022 CLINICAL DATA:  Weakness. EXAM: PORTABLE CHEST 1 VIEW COMPARISON:  04/30/2022. FINDINGS: The heart size and mediastinal contours are within normal limits. Both lungs are clear. No acute osseous abnormality. IMPRESSION: No active disease. Electronically Signed   By: Thornell Sartorius M.D.   On: 06/18/2022 04:27   CT Cervical Spine Wo Contrast  Result Date: 06/18/2022 CLINICAL DATA:  Loss of consciousness. EXAM: CT CERVICAL SPINE WITHOUT CONTRAST TECHNIQUE: Multidetector CT imaging of the cervical spine was performed without intravenous contrast. Multiplanar CT image reconstructions were also generated. RADIATION DOSE REDUCTION: This exam was performed according to the departmental dose-optimization program which includes automated exposure control, adjustment of  the mA and/or kV according to patient size and/or use of iterative reconstruction technique. COMPARISON:  None Available. FINDINGS: Alignment: There is straightening of the normal cervical spine lordosis. Skull base and vertebrae: No acute fracture. Chronic degenerative changes seen along the tip of the dens and adjacent portion of the anterior arch of C1. Soft tissues and spinal canal: No prevertebral fluid or swelling. No visible canal hematoma. Disc levels: Mild anterior osteophyte formation is seen at the levels of C6-C7 and C7-T1. Normal intervertebral disc spaces are noted throughout the cervical spine. Bilateral mild-to-moderate severity multilevel facet joint hypertrophy is noted. Upper chest: Negative. Other: None. IMPRESSION: 1. No acute fracture or subluxation in the cervical spine. 2. Mild degenerative changes at the levels of C6-C7 and C7-T1. Electronically Signed   By: Aram Candela M.D.   On: 06/18/2022 03:52   CT Head Wo Contrast  Result Date: 06/18/2022 CLINICAL DATA:  Loss of consciousness. EXAM: CT HEAD WITHOUT CONTRAST TECHNIQUE: Contiguous axial images were obtained from the base of the skull through the vertex without intravenous contrast. RADIATION DOSE REDUCTION: This exam was performed according to the departmental dose-optimization program which includes automated exposure control, adjustment of the mA and/or kV according to patient size and/or use of iterative reconstruction technique. COMPARISON:  None Available. FINDINGS: Brain: No evidence of acute infarction, hemorrhage, hydrocephalus, extra-axial collection or mass lesion/mass effect. Vascular: No hyperdense vessel or unexpected calcification. Skull: Normal. Negative for fracture or focal lesion. Sinuses/Orbits: There is moderate severity right ethmoid sinus mucosal thickening. Other: None. IMPRESSION: 1. No acute intracranial abnormality. 2. Moderate severity right ethmoid sinus disease. Electronically Signed   By: Aram Candela M.D.   On: 06/18/2022 03:50    EKG: Independently reviewed.  Sinus, no acute ST changes.  Assessment/Plan Principal Problem:   Syncope Active Problems:   Chest pain  (please populate well all problems here in Problem List. (For example, if patient is on BP meds at home and you resume or decide to hold them, it is a problem that needs to be her. Same for CAD, COPD, HLD and so on)  Syncope -Has some features of vasovagal with prodrome of feeling nausea and lightheaded.  However medication/beta-blocker side effect should be considered at this point given the reported bradycardia which I did not see on any of the ED  telemetry monitoring record (wife's report at home). -Agreed with hold metoprolol for now. -Cardiology follows, may need outpatient Zio patch monitor monitoring.  Chest pain -Recurrent, on and off since July this year.  Cardiology ordered outpatient coronary CT angiogram. -ACS ruled out -Appears most of the symptoms related to stress, mood swing.  Defer to cardiology to proceed with outpatient CT angiogram versus inpatient. -Given the risk of CAD, start patient on aspirin 81 mg daily.  LDL 138, consider starting small dose of statin on discharge.  HTN -Uncontrolled, increase amlodipine to 10 mg daily -Metoprolol on hold -Cardiology started patient on HCTZ, sodium 133, monitor sodium level, consider switch HCTZ to ACEI.  Dehydration -Slight elevation of creatinine level, short course of steroid and recheck BMP tomorrow. -Consider ACEI/ARB once kidney function back to baseline.  Hypokalemia -P.o. replacement, check magnesium level  Hyponatremia -Euvolemic, consider related to recently started SSRI.  Repeat sodium level tomorrow, consider switch HCTZ to other BP meds.  Elevated glucose -A1c= 5.4, diabetes rule out.  Anxiety/depression -Continue Zoloft, monitor sodium level  Mild intermittent asthma -No acute concerns.    DVT prophylaxis: SCD Code Status:  Full code Family Communication: Wife at bedside Disposition Plan: Expect less than 2 midnight hospital stay Consults called: Cardiology Admission status: Telemetry observation   Emeline General MD [facility] Pager [phone]  06/18/2022, 8:59 AM

## 2022-06-18 NOTE — ED Notes (Addendum)
Pt is requesting to speak with Cardiology for an update. Paged Cards.

## 2022-06-19 DIAGNOSIS — R55 Syncope and collapse: Secondary | ICD-10-CM | POA: Diagnosis not present

## 2022-06-19 LAB — BASIC METABOLIC PANEL
Anion gap: 9 (ref 5–15)
BUN: 8 mg/dL (ref 6–20)
CO2: 28 mmol/L (ref 22–32)
Calcium: 9.4 mg/dL (ref 8.9–10.3)
Chloride: 100 mmol/L (ref 98–111)
Creatinine, Ser: 1.01 mg/dL (ref 0.61–1.24)
GFR, Estimated: >60 mL/min (ref >60)
Glucose, Bld: 112 mg/dL — ABNORMAL HIGH (ref 70–99)
Potassium: 3.6 mmol/L (ref 3.5–5.1)
Sodium: 137 mmol/L (ref 135–145)

## 2022-06-19 MED ORDER — ASPIRIN 81 MG PO TBEC: 81.0000 mg | DELAYED_RELEASE_TABLET | Freq: Every day | ORAL | 0 refills | Status: AC

## 2022-06-19 MED ORDER — AMLODIPINE BESYLATE 10 MG PO TABS: 10.0000 mg | ORAL_TABLET | Freq: Every day | ORAL | 0 refills | Status: DC

## 2022-06-19 NOTE — ED Notes (Signed)
MD secure chat and made aware the pt came out and stated talking about the incident (which he said he talked about with you) made him relive it again and wants to talk to pysch before he leaves.  I did however tell him about the walk in clinic and that I tmight tak a while to get them but I would let you make that call

## 2022-06-19 NOTE — ED Notes (Signed)
Patient walks out of room to nursing station expressing "I feel constipated" while rubbing belly. RN notified provider, awaiting new orders.

## 2022-06-19 NOTE — Progress Notes (Signed)
Visited with pt.to provided emotional and spiritual support. Pt recently lost job and is struggling with stress due to  family and new economic challenges. Prayed with pt. Pt  scheduled for discharge today. Chaplain available as needed. I gave pt. My Chaplain business card.  Venida Jarvis, [place], [facility], Pager [phone]

## 2022-06-19 NOTE — Progress Notes (Signed)
Rounding Note    Patient Name: Casey [name] Date of Encounter: 06/19/2022  [facility] Cardiologist: Donato Schultz, MD   Subjective   No complaints  Inpatient Medications    Scheduled Meds:  amLODipine  10 mg Oral Daily   aspirin EC  81 mg Oral Daily   umeclidinium bromide  1 puff Inhalation Daily   And   fluticasone furoate-vilanterol  1 puff Inhalation Daily   hydrochlorothiazide  12.5 mg Oral Daily   lidocaine  1 patch Transdermal Q24H   lidocaine  1 patch Transdermal Q24H   sertraline  25 mg Oral Daily   zinc sulfate  220 mg Oral Daily   Continuous Infusions:  PRN Meds: acetaminophen, albuterol, ALPRAZolam, HYDROcodone-acetaminophen, ondansetron (ZOFRAN) IV   Vital Signs    Vitals:   06/18/22 2215 06/19/22 0434 06/19/22 0435 06/19/22 0816  BP: (!) 146/87 (!) 147/91  (!) 144/94  Pulse: 84 89 88 98  Resp: 14 16 14 16   Temp:   98.9 F (37.2 C) 98.7 F (37.1 C)  TempSrc:   Oral Oral  SpO2: 98% 99% 98% 96%  Weight:      Height:       No intake or output data in the 24 hours ending 06/19/22 0855    06/18/2022    5:44 AM 05/07/2022    8:59 AM 04/30/2022    4:45 AM  Last 3 Weights  Weight (lbs) 205 lb 211 lb 215 lb  Weight (kg) 92.987 kg 95.709 kg 97.523 kg      Telemetry    Sinus - Personally Reviewed  ECG    No am tracing   Physical Exam   GEN: No acute distress.   Neck: No JVD Cardiac: RRR, no murmurs, rubs, or gallops.  Respiratory: Clear to auscultation bilaterally. GI: Soft, nontender, non-distended  MS: No edema; No deformity. Neuro:  Nonfocal  Psych: Normal affect   Labs    High Sensitivity Troponin:   Recent Labs  Lab 06/18/22 0320 06/18/22 0533  TROPONINIHS 8 9     Chemistry Recent Labs  Lab 06/18/22 0320 06/19/22 0353  NA 133* 137  K 3.3* 3.6  CL 96* 100  CO2 24 28  GLUCOSE 162* 112*  BUN 13 8  CREATININE 1.34* 1.01  CALCIUM 9.6 9.4  PROT 7.7  --   ALBUMIN 4.5  --   AST 23  --   ALT 23  --   ALKPHOS  81  --   BILITOT 2.4*  --   GFRNONAA >60 >60  ANIONGAP 13 9    Lipids  Recent Labs  Lab 06/18/22 0320  CHOL 174  TRIG 48  HDL 26*  LDLCALC 138*  CHOLHDL 6.7    Hematology Recent Labs  Lab 06/18/22 0320  WBC 10.1  RBC 5.44  HGB 17.0  HCT 46.8  MCV 86.0  MCH 31.3  MCHC 36.3*  RDW 12.3  PLT 241   Thyroid  Recent Labs  Lab 06/18/22 0320  TSH 2.411    BNPNo results for input(s): "BNP", "PROBNP" in the last 168 hours.  DDimer No results for input(s): "DDIMER" in the last 168 hours.   Radiology    ECHOCARDIOGRAM COMPLETE  Result Date: 06/18/2022    ECHOCARDIOGRAM REPORT   Patient Name:   Casey [name] Date of Exam: 06/18/2022 Medical Rec #:  06/20/2022      Height:       71.0 in Accession #:    [id number]  Weight:       205.0 lb Date of Birth:  [birth year]      BSA:          2.131 m Patient Age:    51 years       BP:           137/87 mmHg Patient Gender: M              HR:           87 bpm. Exam Location:  Inpatient Procedure: 2D Echo, Cardiac Doppler, Color Doppler and Strain Analysis Indications:    Chest pain  History:        Patient has prior history of Echocardiogram examinations, most                 recent 07/01/2020. Signs/Symptoms:Shortness of Breath and Chest                 Pain; Risk Factors:Obesity and Dyslipidemia. COVID-19.  Sonographer:    Milda Smart Referring Phys: [id number] PING T ZHANG  Sonographer Comments: Technically difficult study due to poor echo windows. Image acquisition challenging due to patient body habitus and Image acquisition challenging due to respiratory motion. Global longitudinal strain was attempted. Pt has requested that Dr. Anne Fu share the results of this echo with them. IMPRESSIONS  1. Left ventricular ejection fraction, by estimation, is >75%. Left ventricular ejection fraction by PLAX is 78 %. The left ventricle has hyperdynamic function. The left ventricle has no regional wall motion abnormalities. Left ventricular diastolic  parameters are consistent with Grade I diastolic dysfunction (impaired relaxation).  2. Right ventricular systolic function is normal. The right ventricular size is normal. Tricuspid regurgitation signal is inadequate for assessing PA pressure.  3. The mitral valve is grossly normal. No evidence of mitral valve regurgitation.  4. The aortic valve is tricuspid. Aortic valve regurgitation is not visualized. Aortic valve sclerosis is present, with no evidence of aortic valve stenosis. Comparison(s): Changes from prior study are noted. 07/01/2020: LVEF 55-60%, aortic valve sclerosis. FINDINGS  Left Ventricle: Left ventricular ejection fraction, by estimation, is >75%. Left ventricular ejection fraction by PLAX is 78 %. The left ventricle has hyperdynamic function. The left ventricle has no regional wall motion abnormalities. The left ventricular internal cavity size was normal in size. There is no left ventricular hypertrophy. Left ventricular diastolic parameters are consistent with Grade I diastolic dysfunction (impaired relaxation). Indeterminate filling pressures. Right Ventricle: The right ventricular size is normal. No increase in right ventricular wall thickness. Right ventricular systolic function is normal. Tricuspid regurgitation signal is inadequate for assessing PA pressure. Left Atrium: Left atrial size was normal in size. Right Atrium: Right atrial size was normal in size. Pericardium: There is no evidence of pericardial effusion. Mitral Valve: The mitral valve is grossly normal. No evidence of mitral valve regurgitation. Tricuspid Valve: The tricuspid valve is grossly normal. Tricuspid valve regurgitation is trivial. Aortic Valve: The aortic valve is tricuspid. Aortic valve regurgitation is not visualized. Aortic valve sclerosis is present, with no evidence of aortic valve stenosis. Pulmonic Valve: The pulmonic valve was normal in structure. Pulmonic valve regurgitation is not visualized. Aorta: The aortic  root and ascending aorta are structurally normal, with no evidence of dilitation. IAS/Shunts: No atrial level shunt detected by color flow Doppler.  LEFT VENTRICLE PLAX 2D LV EF:         Left            Diastology  ventricular     LV e' medial:    7.07 cm/s                ejection        LV E/e' medial:  10.0                fraction by     LV e' lateral:   10.80 cm/s                PLAX is 78      LV E/e' lateral: 6.5                %. LVIDd:         3.70 cm LVIDs:         2.00 cm LV PW:         0.80 cm LV IVS:        0.90 cm LVOT diam:     1.70 cm LV SV:         57 LV SV Index:   27 LVOT Area:     2.27 cm  RIGHT VENTRICLE RV S prime:     16.70 cm/s TAPSE (M-mode): 1.7 cm LEFT ATRIUM             Index        RIGHT ATRIUM           Index LA diam:        2.20 cm 1.03 cm/m   RA Area:     14.70 cm LA Vol (A2C):   33.7 ml 15.82 ml/m  RA Volume:   34.80 ml  16.33 ml/m LA Vol (A4C):   12.4 ml 5.82 ml/m LA Biplane Vol: 21.4 ml 10.04 ml/m  AORTIC VALVE LVOT Vmax:   149.00 cm/s LVOT Vmean:  113.000 cm/s LVOT VTI:    0.253 m  AORTA Ao Root diam: 2.80 cm MITRAL VALVE MV Area (PHT): 3.45 cm    SHUNTS MV Decel Time: 220 msec    Systemic VTI:  0.25 m MV E velocity: 70.70 cm/s  Systemic Diam: 1.70 cm MV A velocity: 56.60 cm/s MV E/A ratio:  1.25 Zoila Shutter MD Electronically signed by Zoila Shutter MD Signature Date/Time: 06/18/2022/2:07:33 PM    Final    DG Chest Port 1 View  Result Date: 06/18/2022 CLINICAL DATA:  Weakness. EXAM: PORTABLE CHEST 1 VIEW COMPARISON:  04/30/2022. FINDINGS: The heart size and mediastinal contours are within normal limits. Both lungs are clear. No acute osseous abnormality. IMPRESSION: No active disease. Electronically Signed   By: Thornell Sartorius M.D.   On: 06/18/2022 04:27   CT Cervical Spine Wo Contrast  Result Date: 06/18/2022 CLINICAL DATA:  Loss of consciousness. EXAM: CT CERVICAL SPINE WITHOUT CONTRAST TECHNIQUE: Multidetector CT imaging of the cervical spine was  performed without intravenous contrast. Multiplanar CT image reconstructions were also generated. RADIATION DOSE REDUCTION: This exam was performed according to the departmental dose-optimization program which includes automated exposure control, adjustment of the mA and/or kV according to patient size and/or use of iterative reconstruction technique. COMPARISON:  None Available. FINDINGS: Alignment: There is straightening of the normal cervical spine lordosis. Skull base and vertebrae: No acute fracture. Chronic degenerative changes seen along the tip of the dens and adjacent portion of the anterior arch of C1. Soft tissues and spinal canal: No prevertebral fluid or swelling. No visible canal hematoma. Disc levels: Mild anterior osteophyte formation is seen at the levels of C6-C7 and C7-T1. Normal intervertebral disc  spaces are noted throughout the cervical spine. Bilateral mild-to-moderate severity multilevel facet joint hypertrophy is noted. Upper chest: Negative. Other: None. IMPRESSION: 1. No acute fracture or subluxation in the cervical spine. 2. Mild degenerative changes at the levels of C6-C7 and C7-T1. Electronically Signed   By: Aram Candela M.D.   On: 06/18/2022 03:52   CT Head Wo Contrast  Result Date: 06/18/2022 CLINICAL DATA:  Loss of consciousness. EXAM: CT HEAD WITHOUT CONTRAST TECHNIQUE: Contiguous axial images were obtained from the base of the skull through the vertex without intravenous contrast. RADIATION DOSE REDUCTION: This exam was performed according to the departmental dose-optimization program which includes automated exposure control, adjustment of the mA and/or kV according to patient size and/or use of iterative reconstruction technique. COMPARISON:  None Available. FINDINGS: Brain: No evidence of acute infarction, hemorrhage, hydrocephalus, extra-axial collection or mass lesion/mass effect. Vascular: No hyperdense vessel or unexpected calcification. Skull: Normal. Negative for  fracture or focal lesion. Sinuses/Orbits: There is moderate severity right ethmoid sinus mucosal thickening. Other: None. IMPRESSION: 1. No acute intracranial abnormality. 2. Moderate severity right ethmoid sinus disease. Electronically Signed   By: Aram Candela M.D.   On: 06/18/2022 03:50     Assessment & Plan     51 y.o. male with history of HTN and depression admitted after syncopal episode. This occurred on the evening after he started several new medications for depression and HTN. He was bradycardic on arrival to [facility] but has since had sinus rhythm. No heart block on tele. No recurrent bradycardia. Echo with hyperdynamic LV function. I suspect there was some degree of hypovolemia contributing to his syncopal event as he reported poor po intake at home for several weeks. No further cardiac workup indicated. OK to discharge home today from cardiac perspective.    For questions or updates, please contact [facility] Please consult [email or web address] for contact info under        Signed, Verne Carrow, MD  06/19/2022, 8:55 AM

## 2022-06-19 NOTE — Consult Note (Signed)
Brief Psychiatry Consult Note   Date of service: June 19, 2022 Patient Name: Casey [name] DOB: [birth year] MRN: [id number] Reason for consult: "anxiety" Requesting Provider: Darlin Drop, DO  Psychiatry consulted for anxiety. Per attending physician, patient with difficult psychosocial situation and wanted to speak with psychiatry, of which he refused to provide detail to her. He denies SI/HI or any other acute safety concerns. Patient recently started on Zoloft approximately one week ago. At this time, would recommend that patient have chaplain and [facility] consults while admitted to discuss his situation and for therapy referrals in-network with his insurance, respectively. An inpatient psychiatry consult is not indicated as patient is not a threat to himself nor anyone else, and no acute medication changes will be made with imminent discharge. Patient also provided with walk-in therapy and medication management resources in discharge paperwork, including [facility], [facility], and [facility] in the case in-network therapy appointment has a delay. Thank you for this consult, and please feel free to reach out to our team if any further psychiatric concerns arise.    Signed: Lamar Sprinkles, MD Psychiatry Resident, PGY-2 [facility] 06/19/2022, 10:36 AM

## 2022-06-19 NOTE — ED Notes (Signed)
Bed placement notified Pt will be d/c after TTS has taken place

## 2022-06-19 NOTE — Discharge Summary (Addendum)
Discharge Summary  Casey [name]:[id number] DOB: 09/14/1971  PCP: Casey Dills, MD  Admit date: 06/18/2022 Discharge date: 06/19/2022  Time spent: 35 minutes.  Recommendations for Outpatient Follow-up:  Follow-up with your primary care provider in 1 to 2 weeks. Follow-up with cardiology in 1 to 2 weeks. Follow-up with psychiatry. Take your medications as prescribed. Continue fall precautions.  Discharge Diagnoses:  Active Hospital Problems   Diagnosis Date Noted   Syncope 06/18/2022   Chest pain 06/11/2020    Resolved Hospital Problems  No resolved problems to display.    Discharge Condition: Stable  Diet recommendation: Resume previous diet.  Vitals:   06/19/22 1300 06/19/22 1322  BP: (!) 145/88 128/83  Pulse: 98 81  Resp: (!) 26 18  Temp:  98.7 F (37.1 C)  SpO2: 93% 97%    History of present illness:   From H&P dictated by Dr. Chipper Herb: Casey [name] is a 51 y.o. male with medical history significant of HTN, mild intermittent asthma, anxiety/depression, presented with chest pain and syncope.   Patient first developed symptoms of chest pain back in July came to ED, ACS ruled out and patient discharged home.  Subsequently, patient follow-up with cardiology in the office on August 3, when it was found the patient had elevated blood pressure despite already on amlodipine, and admitted, metoprolol was added and patient is to schedule for outpatient coronary artery CT angiogram.  Patient reported he has been facing a lot of stress in his life and was started on Zoloft this week.  Then, last 3 days, patient started to have episode of pressure-like chest pain centrally located, on and off, not related to activity and he did not take any medication and just let the episode pass by itself.  Denies any associated symptoms.  Last night, patient went to bathroom and had a syncope and fell down, episode lasted less than 1 minute and patient recovered consciousness by himself.   He did remember feeling nausea and lightheaded before the episode.  EMS arrived and found the patient blood pressure elevated 160/85, and patient was given nitro x1 and ASA 324x1.  ED Course: Vital signs stable, no tachycardia no hypotension.  CT head and cervical spine negative for acute findings.  Troponin negative x2, EKG no significant ST-T changes.  06/19/2022: The patient was seen and examined at his bedside.  He feels better.  Denies any chest pain, palpitations or shortness of breath.  He feels anxious.  Recently lost his job.  Seen by chaplain.  Referral provided by psych for follow-up.  Appreciate chaplain and psychiatry's assistance.  Cleared by cardiology for discharge.  Because of syncope was likely vasovagal.  Home metoprolol held as recommended by cardiology.    Hospital Course:  Principal Problem:   Syncope Active Problems:   Chest pain  Syncope, likely vasovagal -Has some features of vasovagal with prodrome of feeling nausea and lightheaded.   Avoid dehydration, home metoprolol also held.   Chest pain, resolved -Recurrent, on and off since July this year.  Cardiology ordered outpatient coronary CT angiogram. -ACS ruled out -Appears most of the symptoms related to stress, mood swing.  Defer to cardiology to proceed with outpatient CT angiogram versus inpatient. -Given the risk of CAD, start patient on aspirin 81 mg daily.   Follow-up with cardiology outpatient.   HTN Home metoprolol held as recommended by cardiology Continue home Norvasc, HCTZ.  Dehydration, resolved   Resolved post repletion hypokalemia Serum potassium 3.6 from 3.3  Resolved hyponatremia -Likely related to new SSRI and HCTZ. Serum sodium 137 on 06/19/2022 from 133. Follow-up with your primary care provider   Elevated glucose -A1c= 5.4, diabetes rule out.   Anxiety/depression -Continue Zoloft, monitor sodium level   Mild intermittent asthma -No acute concerns.        Code Status:  Full code  Consults called: Cardiology      Discharge Exam: BP 128/83 (BP Location: [name]) Comment: Simultaneous filing. User may not have seen previous data.  Pulse 81   Temp 98.7 F (37.1 C) (Oral)   Resp 18   Ht 5\' 11"  (1.803 m)   Wt 93 kg   SpO2 97%   BMI 28.59 kg/m  General: 51 y.o. year-old male well developed well nourished in no acute distress.  Alert and oriented x3. Cardiovascular: Regular rate and rhythm with no rubs or gallops.  No thyromegaly or JVD noted.   Respiratory: Clear to auscultation with no wheezes or rales. Good inspiratory effort. Abdomen: Soft nontender nondistended with normal bowel sounds x4 quadrants. Musculoskeletal: No lower extremity edema. 2/4 pulses in all 4 extremities. Skin: No ulcerative lesions noted or rashes, Psychiatry: Mood is appropriate for condition and setting  Discharge Instructions You were cared for by a hospitalist during your hospital stay. If you have any questions about your discharge medications or the care you received while you were in the hospital after you are discharged, you can call the unit and asked to speak with the hospitalist on call if the hospitalist that took care of you is not available. Once you are discharged, your primary care physician will handle any further medical issues. Please note that NO REFILLS for any discharge medications will be authorized once you are discharged, as it is imperative that you return to your primary care physician (or establish a relationship with a primary care physician if you do not have one) for your aftercare needs so that they can reassess your need for medications and monitor your lab values.   Allergies as of 06/19/2022       Reactions   Other    KAPIDEX STOMACH PAIN   Zegerid [omeprazole]    BURNING IN THE CHEST        Medication List     STOP taking these medications    metoprolol tartrate 50 MG tablet Commonly known as: LOPRESSOR       TAKE these  medications    albuterol 108 (90 Base) MCG/ACT inhaler Commonly known as: VENTOLIN HFA Inhale 2 puffs into the lungs every 6 (six) hours as needed for wheezing or shortness of breath.   amLODipine 10 MG tablet Commonly known as: NORVASC Take 1 tablet (10 mg total) by mouth daily. What changed:  medication strength how much to take   aspirin EC 81 MG tablet Take 1 tablet (81 mg total) by mouth daily. Swallow whole.   Breztri Aerosphere 160-9-4.8 MCG/ACT Aero Generic drug: Budeson-Glycopyrrol-Formoterol Inhale 1 puff into the lungs daily as needed (for shortness of breath and cough).   hydrochlorothiazide 12.5 MG capsule Commonly known as: MICROZIDE Take 12.5 mg by mouth daily.   sertraline 25 MG tablet Commonly known as: ZOLOFT Take 25 mg by mouth daily.   zinc gluconate 50 MG tablet Take 50 mg by mouth daily.       Allergies  Allergen Reactions   Other     KAPIDEX STOMACH PAIN   Zegerid [Omeprazole]     BURNING IN THE CHEST  Follow-up Information     [facility] Follow up.   Specialty: Urgent Care Why: Walk-ins available for therapy and medication management., As needed Contact information: [address] [place] [address] [phone]        [facility] Follow up.   Why: As needed, walk-ins available for therapy and medication management. Contact information: [address] [place] [address] [phone]         Services, [facility] Follow up.   Why: As needed, walk-ins available for therapy and medication management. Contact information: [address] [place] [address] [phone]         Casey Dills, MD. Call today.   Specialty: Internal Medicine Why: Please call for a posthospital follow-up appointment. Contact information: [address] [place] [address] [phone]         Jake Bathe, MD .   Specialty:  Cardiology Contact information: [phone] [address] [place] [address] [phone]                  The results of significant diagnostics from this hospitalization (including imaging, microbiology, ancillary and laboratory) are listed below for reference.    Significant Diagnostic Studies: ECHOCARDIOGRAM COMPLETE  Result Date: 06/18/2022    ECHOCARDIOGRAM REPORT   Patient Name:   Benzion SHERMAN DONALDSON Date of Exam: 06/18/2022 Medical Rec #:  [id number]      Height:       71.0 in Accession #:    [id number]     Weight:       205.0 lb Date of Birth:  1971/03/22      BSA:          2.131 m Patient Age:    51 years       BP:           137/87 mmHg Patient Gender: M              HR:           87 bpm. Exam Location:  Inpatient Procedure: 2D Echo, Cardiac Doppler, Color Doppler and Strain Analysis Indications:    Chest pain  History:        Patient has prior history of Echocardiogram examinations, most                 recent 07/01/2020. Signs/Symptoms:Shortness of Breath and Chest                 Pain; Risk Factors:Obesity and Dyslipidemia. COVID-19.  Sonographer:    Milda Smart Referring Phys: [id number] PING T ZHANG  Sonographer Comments: Technically difficult study due to poor echo windows. Image acquisition challenging due to patient body habitus and Image acquisition challenging due to respiratory motion. Global longitudinal strain was attempted. Pt has requested that Dr. Anne Fu share the results of this echo with them. IMPRESSIONS  1. Left ventricular ejection fraction, by estimation, is >75%. Left ventricular ejection fraction by PLAX is 78 %. The left ventricle has hyperdynamic function. The left ventricle has no regional wall motion abnormalities. Left ventricular diastolic parameters are consistent with Grade I diastolic dysfunction (impaired relaxation).  2. [name] ventricular systolic function is normal. The [name] ventricular size is normal. Tricuspid regurgitation signal is inadequate  for assessing PA pressure.  3. The mitral valve is grossly normal. No evidence of mitral valve regurgitation.  4. The aortic valve is tricuspid. Aortic valve regurgitation is not visualized. Aortic  valve sclerosis is present, with no evidence of aortic valve stenosis. Comparison(s): Changes from prior study are noted. 07/01/2020: LVEF 55-60%, aortic valve sclerosis. FINDINGS  Left Ventricle: Left ventricular ejection fraction, by estimation, is >75%. Left ventricular ejection fraction by PLAX is 78 %. The left ventricle has hyperdynamic function. The left ventricle has no regional wall motion abnormalities. The left ventricular internal cavity size was normal in size. There is no left ventricular hypertrophy. Left ventricular diastolic parameters are consistent with Grade I diastolic dysfunction (impaired relaxation). Indeterminate filling pressures. [name] Ventricle: The [name] ventricular size is normal. No increase in [name] ventricular wall thickness. [name] ventricular systolic function is normal. Tricuspid regurgitation signal is inadequate for assessing PA pressure. Left Atrium: Left atrial size was normal in size. [name] Atrium: [name] atrial size was normal in size. Pericardium: There is no evidence of pericardial effusion. Mitral Valve: The mitral valve is grossly normal. No evidence of mitral valve regurgitation. Tricuspid Valve: The tricuspid valve is grossly normal. Tricuspid valve regurgitation is trivial. Aortic Valve: The aortic valve is tricuspid. Aortic valve regurgitation is not visualized. Aortic valve sclerosis is present, with no evidence of aortic valve stenosis. Pulmonic Valve: The pulmonic valve was normal in structure. Pulmonic valve regurgitation is not visualized. Aorta: The aortic root and ascending aorta are structurally normal, with no evidence of dilitation. IAS/Shunts: No atrial level shunt detected by color flow Doppler.  LEFT VENTRICLE PLAX 2D LV EF:         Left            Diastology                 ventricular     LV e' medial:    7.07 cm/s                ejection        LV E/e' medial:  10.0                fraction by     LV e' lateral:   10.80 cm/s                PLAX is 78      LV E/e' lateral: 6.5                %. LVIDd:         3.70 cm LVIDs:         2.00 cm LV PW:         0.80 cm LV IVS:        0.90 cm LVOT diam:     1.70 cm LV SV:         57 LV SV Index:   27 LVOT Area:     2.27 cm  [name] VENTRICLE RV S prime:     16.70 cm/s TAPSE (M-mode): 1.7 cm LEFT ATRIUM             Index        [name] ATRIUM           Index LA diam:        2.20 cm 1.03 cm/m   RA Area:     14.70 cm LA Vol (A2C):   33.7 ml 15.82 ml/m  RA Volume:   34.80 ml  16.33 ml/m LA Vol (A4C):   12.4 ml 5.82 ml/m LA Biplane Vol: 21.4 ml 10.04 ml/m  AORTIC VALVE LVOT Vmax:   149.00 cm/s LVOT Vmean:  113.000 cm/s LVOT VTI:  0.253 m  AORTA Ao Root diam: 2.80 cm MITRAL VALVE MV Area (PHT): 3.45 cm    SHUNTS MV Decel Time: 220 msec    Systemic VTI:  0.25 m MV E velocity: 70.70 cm/s  Systemic Diam: 1.70 cm MV A velocity: 56.60 cm/s MV E/A ratio:  1.25 Zoila Shutter MD Electronically signed by Zoila Shutter MD Signature Date/Time: 06/18/2022/2:07:33 PM    Final    DG Chest Port 1 View  Result Date: 06/18/2022 CLINICAL DATA:  Weakness. EXAM: PORTABLE CHEST 1 VIEW COMPARISON:  04/30/2022. FINDINGS: The heart size and mediastinal contours are within normal limits. Both lungs are clear. No acute osseous abnormality. IMPRESSION: No active disease. Electronically Signed   By: Thornell Sartorius M.D.   On: 06/18/2022 04:27   CT Cervical Spine Wo Contrast  Result Date: 06/18/2022 CLINICAL DATA:  Loss of consciousness. EXAM: CT CERVICAL SPINE WITHOUT CONTRAST TECHNIQUE: Multidetector CT imaging of the cervical spine was performed without intravenous contrast. Multiplanar CT image reconstructions were also generated. RADIATION DOSE REDUCTION: This exam was performed according to the departmental dose-optimization program which includes  automated exposure control, adjustment of the mA and/or kV according to patient size and/or use of iterative reconstruction technique. COMPARISON:  None Available. FINDINGS: Alignment: There is straightening of the normal cervical spine lordosis. Skull base and vertebrae: No acute fracture. Chronic degenerative changes seen along the tip of the dens and adjacent portion of the anterior arch of C1. Soft tissues and spinal canal: No prevertebral fluid or swelling. No visible canal hematoma. Disc levels: Mild anterior osteophyte formation is seen at the levels of C6-C7 and C7-T1. Normal intervertebral disc spaces are noted throughout the cervical spine. Bilateral mild-to-moderate severity multilevel facet joint hypertrophy is noted. Upper chest: Negative. Other: None. IMPRESSION: 1. No acute fracture or subluxation in the cervical spine. 2. Mild degenerative changes at the levels of C6-C7 and C7-T1. Electronically Signed   By: Aram Candela M.D.   On: 06/18/2022 03:52   CT Head Wo Contrast  Result Date: 06/18/2022 CLINICAL DATA:  Loss of consciousness. EXAM: CT HEAD WITHOUT CONTRAST TECHNIQUE: Contiguous axial images were obtained from the base of the skull through the vertex without intravenous contrast. RADIATION DOSE REDUCTION: This exam was performed according to the departmental dose-optimization program which includes automated exposure control, adjustment of the mA and/or kV according to patient size and/or use of iterative reconstruction technique. COMPARISON:  None Available. FINDINGS: Brain: No evidence of acute infarction, hemorrhage, hydrocephalus, extra-axial collection or mass lesion/mass effect. Vascular: No hyperdense vessel or unexpected calcification. Skull: Normal. Negative for fracture or focal lesion. Sinuses/Orbits: There is moderate severity [name] ethmoid sinus mucosal thickening. Other: None. IMPRESSION: 1. No acute intracranial abnormality. 2. Moderate severity [name] ethmoid sinus  disease. Electronically Signed   By: Aram Candela M.D.   On: 06/18/2022 03:50    Microbiology: Recent Results (from the past 240 hour(s))  Resp Panel by RT-PCR (Flu A&B, Covid) Anterior Nasal Swab     Status: None   Collection Time: 06/18/22  5:33 AM   Specimen: Anterior Nasal Swab  Result Value Ref Range Status   SARS Coronavirus 2 by RT PCR NEGATIVE NEGATIVE Final    Comment: (NOTE) SARS-CoV-2 target nucleic acids are NOT DETECTED.  The SARS-CoV-2 RNA is generally detectable in upper respiratory specimens during the acute phase of infection. The lowest concentration of SARS-CoV-2 viral copies this assay can detect is 138 copies/mL. A negative result does not preclude SARS-Cov-2 infection and should not be  used as the sole basis for treatment or other patient management decisions. A negative result may occur with  improper specimen collection/handling, submission of specimen other than nasopharyngeal swab, presence of viral mutation(s) within the areas targeted by this assay, and inadequate number of viral copies(<138 copies/mL). A negative result must be combined with clinical observations, patient history, and epidemiological information. The expected result is Negative.  Fact Sheet for Patients:  BloggerCourse.com  Fact Sheet for Healthcare Providers:  SeriousBroker.it  This test is no t yet approved or cleared by the [place] FDA and  has been authorized for detection and/or diagnosis of SARS-CoV-2 by FDA under an Emergency Use Authorization (EUA). This EUA will remain  in effect (meaning this test can be used) for the duration of the COVID-19 declaration under Section 564(b)(1) of the Act, 21 U.S.C.section 360bbb-3(b)(1), unless the authorization is terminated  or revoked sooner.       Influenza A by PCR NEGATIVE NEGATIVE Final   Influenza B by PCR NEGATIVE NEGATIVE Final    Comment: (NOTE) The Xpert Xpress  SARS-CoV-2/FLU/RSV plus assay is intended as an aid in the diagnosis of influenza from Nasopharyngeal swab specimens and should not be used as a sole basis for treatment. Nasal washings and aspirates are unacceptable for Xpert Xpress SARS-CoV-2/FLU/RSV testing.  Fact Sheet for Patients: BloggerCourse.com  Fact Sheet for Healthcare Providers: SeriousBroker.it  This test is not yet approved or cleared by the [place] FDA and has been authorized for detection and/or diagnosis of SARS-CoV-2 by FDA under an Emergency Use Authorization (EUA). This EUA will remain in effect (meaning this test can be used) for the duration of the COVID-19 declaration under Section 564(b)(1) of the Act, 21 U.S.C. section 360bbb-3(b)(1), unless the authorization is terminated or revoked.  Performed at [facility], [address] [place], [place] [address]      Labs: Basic Metabolic Panel: Recent Labs  Lab 06/18/22 0320 06/19/22 0353  NA 133* 137  K 3.3* 3.6  CL 96* 100  CO2 24 28  GLUCOSE 162* 112*  BUN 13 8  CREATININE 1.34* 1.01  CALCIUM 9.6 9.4   Liver Function Tests: Recent Labs  Lab 06/18/22 0320  AST 23  ALT 23  ALKPHOS 81  BILITOT 2.4*  PROT 7.7  ALBUMIN 4.5   No results for input(s): "LIPASE", "AMYLASE" in the last 168 hours. No results for input(s): "AMMONIA" in the last 168 hours. CBC: Recent Labs  Lab 06/18/22 0320  WBC 10.1  NEUTROABS 6.6  HGB 17.0  HCT 46.8  MCV 86.0  PLT 241   Cardiac Enzymes: No results for input(s): "CKTOTAL", "CKMB", "CKMBINDEX", "TROPONINI" in the last 168 hours. BNP: BNP (last 3 results) No results for input(s): "BNP" in the last 8760 hours.  ProBNP (last 3 results) No results for input(s): "PROBNP" in the last 8760 hours.  CBG: No results for input(s): "GLUCAP" in the last 168 hours.     Signed:  Darlin Drop, MD [facility] 06/19/2022, 5:10 PM

## 2022-06-23 ENCOUNTER — Encounter: Payer: Self-pay | Admitting: Physician Assistant

## 2022-06-23 ENCOUNTER — Ambulatory Visit: Payer: BC Managed Care – PPO | Attending: Physician Assistant | Admitting: Physician Assistant

## 2022-06-23 VITALS — BP 138/86 | HR 91 | Ht 71.0 in | Wt 200.8 lb

## 2022-06-23 DIAGNOSIS — R079 Chest pain, unspecified: Secondary | ICD-10-CM | POA: Diagnosis not present

## 2022-06-23 DIAGNOSIS — Z8249 Family history of ischemic heart disease and other diseases of the circulatory system: Secondary | ICD-10-CM

## 2022-06-23 DIAGNOSIS — R55 Syncope and collapse: Secondary | ICD-10-CM

## 2022-06-23 DIAGNOSIS — I10 Essential (primary) hypertension: Secondary | ICD-10-CM | POA: Diagnosis not present

## 2022-06-23 DIAGNOSIS — F419 Anxiety disorder, unspecified: Secondary | ICD-10-CM

## 2022-06-23 DIAGNOSIS — F32A Depression, unspecified: Secondary | ICD-10-CM

## 2022-06-23 NOTE — Progress Notes (Signed)
Cardiology Office Note:    Date:  06/23/2022   ID:  Casey [name], DOB 1971/04/10, MRN [id number]  PCP:  Renford Dills, MD  [facility] Providers Cardiologist:  Donato Schultz, MD     Referring MD: Renford Dills, MD   Chief Complaint:  No chief complaint on file.     History of Present Illness:   Casey [name] is a 51 y.o. male with  history of HTN,  depression, family history of CAD-father with CABG, Covid pneumonia with syncope 04/2022. He saw Dr. Anne Fu 05/07/22 with chest pain and abnormal EKG with diffuse J-point elevation, no change 2021.  Coronary CTA ordered but patient was reluctant to schedule.   He was re-admitted after syncopal episode. This occurred on the evening after he started several new medications for depression and HTN. He was bradycardic on arrival to [facility] but has since had sinus rhythm. No heart block on tele. No recurrent bradycardia. Echo with hyperdynamic LV function. I suspect there was some degree of hypovolemia contributing to his syncopal event as he reported poor po intake at home for several weeks. No further cardiac workup indicated.   Patient comes in with his wife. Today is a better day. He's  doing therapy, sleeping better. Says he was winded in [employer] yesterday and used a wheelchair. 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    Past Medical History:  Diagnosis Date   Bronchitis    Chest pain on exertion    COVID-19    Hyperlipidemia    Obesity    Pneumonia    Respiratory failure (HCC)    SOB (shortness of breath)    Current Medications: Current Meds  Medication Sig   amLODipine (NORVASC) 10 MG tablet Take 1 tablet (10 mg total) by mouth daily.   sertraline (ZOLOFT) 25 MG tablet Take 25 mg by mouth daily.   zinc gluconate 50 MG tablet Take 50 mg by mouth daily.    Allergies:   Other and Zegerid [omeprazole]   Social History   Tobacco Use   Smoking status: Former   Smokeless tobacco: Never   Substance Use Topics   Alcohol use: No   Drug use: Never    Family Hx: The patient's family history is not on file.  ROS   EKGs/Labs/Other Test Reviewed:    EKG:  EKG is  not ordered today.    Recent Labs: 04/30/2022: Magnesium 2.2 06/18/2022: ALT 23; Hemoglobin 17.0; Platelets 241; TSH 2.411 06/19/2022: BUN 8; Creatinine, Ser 1.01; Potassium 3.6; Sodium 137   Recent Lipid Panel Recent Labs    06/18/22 0320  CHOL 174  TRIG 48  HDL 26*  VLDL 10  LDLCALC 119*     Prior CV Studies:     Echo 06/18/22 IMPRESSIONS     1. Left ventricular ejection fraction, by estimation, is >75%. Left  ventricular ejection fraction by PLAX is 78 %. The left ventricle has  hyperdynamic function. The left ventricle has no regional wall motion  abnormalities. Left ventricular diastolic  parameters are consistent with Grade I diastolic dysfunction (impaired  relaxation).   2. [name] ventricular systolic function is normal. The [name] ventricular  size is normal. Tricuspid regurgitation signal is inadequate for assessing  PA pressure.   3. The mitral valve is grossly normal. No evidence of mitral valve  regurgitation.   4. The aortic valve is tricuspid. Aortic valve regurgitation is not  visualized. Aortic valve sclerosis is  present, with no evidence of aortic  valve stenosis.   Comparison(s): Changes from prior study are noted. 07/01/2020: LVEF 55-60%,  aortic valve sclerosis.   FINDINGS   Left Ventricle: Left ventricular ejection fraction, by estimation, is  >75%. Left ventricular ejection fraction by PLAX is 78 %. The left  ventricle has hyperdynamic function. The left ventricle has no regional  wall motion abnormalities. The left  ventricular internal cavity size was normal in size. There is no left  ventricular hypertrophy. Left ventricular diastolic parameters are  consistent with Grade I diastolic dysfunction (impaired relaxation).  Indeterminate filling pressures.   [name]  Ventricle: The [name] ventricular size is normal. No increase in  [name] ventricular wall thickness. [name] ventricular systolic function is  normal. Tricuspid regurgitation signal is inadequate for assessing PA  pressure.   Left Atrium: Left atrial size was normal in size.   [name] Atrium: [name] atrial size was normal in size.   Pericardium: There is no evidence of pericardial effusion.   Mitral Valve: The mitral valve is grossly normal. No evidence of mitral  valve regurgitation.   Tricuspid Valve: The tricuspid valve is grossly normal. Tricuspid valve  regurgitation is trivial.   Aortic Valve: The aortic valve is tricuspid. Aortic valve regurgitation is  not visualized. Aortic valve sclerosis is present, with no evidence of  aortic valve stenosis.   Pulmonic Valve: The pulmonic valve was normal in structure. Pulmonic valve  regurgitation is not visualized.   Aorta: The aortic root and ascending aorta are structurally normal, with  no evidence of dilitation.   IAS/Shunts: No atrial level shunt detected by color flow Doppler.     Bilateral LE Venous Doppler 04/30/2022: Summary:  BILATERAL:  - No evidence of deep vein thrombosis seen in the lower extremities,  bilaterally.  -No evidence of popliteal cyst, bilaterally.  [name]:  - Ultrasound characteristics of enlarged lymph nodes are noted in the  groin.     LEFT:  - Ultrasound characteristics of enlarged lymph nodes noted in the groin.         Echocardiogram 07/01/2020: IMPRESSIONS   1. Global longitudinal strain is -22.5%. Left ventricular ejection  fraction, by estimation, is 55 to 60%. The left ventricle has normal  function. The left ventricle has no regional wall motion abnormalities.  Left ventricular diastolic parameters were  normal.   2. [name] ventricular systolic function is normal. The [name] ventricular  size is normal.   3. The mitral valve is abnormal. Trivial mitral valve regurgitation.   4. The aortic  valve is abnormal. Aortic valve regurgitation is trivial.  Mild aortic valve sclerosis is present, with no evidence of aortic valve  stenosis.   5. The inferior vena cava is normal in size with greater than 50%  respiratory variability, suggesting [name] atrial pressure of 3 mmHg.      Risk Assessment/Calculations/Metrics:              Physical Exam:    VS:  BP 138/86 (BP Location: [name] Arm, Patient Position: Sitting, Cuff Size: Normal)   Pulse 91   Ht 5\' 11"  (1.803 m)   Wt 200 lb 12.8 oz (91.1 kg)   SpO2 97%   BMI 28.01 kg/m     Wt Readings from Last 3 Encounters:  06/23/22 200 lb 12.8 oz (91.1 kg)  06/18/22 205 lb (93 kg)  05/07/22 211 lb (95.7 kg)    Physical Exam  GEN: Well nourished, well developed, in no acute distress  Neck: no  JVD, carotid bruits, or masses Cardiac:RRR; no murmurs, rubs, or gallops  Respiratory:  clear to auscultation bilaterally, normal work of breathing GI: soft, nontender, nondistended, + BS Ext: without cyanosis, clubbing, or edema, Good distal pulses bilaterally Neuro:  Alert and Oriented x 3,  Psych: euthymic mood, full affect       ASSESSMENT & PLAN:   No problem-specific Assessment & Plan notes found for this encounter.   Chest pain with J-point elevation on EKG, HTN, family history of CAD, Coronary CTA ordered but not scheduled-willing to schedule today  Syncope in the setting of multiple new meds for HTN and depression, w/u unremarkable. No further symptoms.  HTN controlled on amlodipine.   Family history of CAD  Anxiety/depression therapy helping          Dispo:  No follow-ups on file.   Medication Adjustments/Labs and Tests Ordered: Current medicines are reviewed at length with the patient today.  Concerns regarding medicines are outlined above.  Tests Ordered: No orders of the defined types were placed in this encounter.  Medication Changes: No orders of the defined types were placed in this  encounter.  Elson Clan, PA-C  06/23/2022 9:23 AM    [facility] [address] [place], [place], [place]  [address] Phone: [phone]; Fax: [phone]

## 2022-06-23 NOTE — Patient Instructions (Signed)
Medication Instructions:  Your physician recommends that you continue on your current medications as directed. Please refer to the Current Medication list given to you today.  *If you need a refill on your cardiac medications before your next appointment, please call your pharmacy*   Lab Work: None If you have labs (blood work) drawn today and your tests are completely normal, you will receive your results only by: [place] (if you have MyChart) OR A paper copy in the mail If you have any lab test that is abnormal or we need to change your treatment, we will call you to review the results.   Follow-Up: At [facility], you and your health needs are our priority.  As part of our continuing mission to provide you with exceptional heart care, we have created designated Provider Care Teams.  These Care Teams include your primary Cardiologist (physician) and Advanced Practice Providers (APPs -  Physician Assistants and Nurse Practitioners) who all work together to provide you with the care you need, when you need it.   Your next appointment:   After CT Scan  The format for your next appointment:   In Person  Provider:   Candee Furbish, MD

## 2022-07-06 ENCOUNTER — Telehealth (HOSPITAL_COMMUNITY): Payer: Self-pay | Admitting: Emergency Medicine

## 2022-07-06 DIAGNOSIS — R079 Chest pain, unspecified: Secondary | ICD-10-CM

## 2022-07-06 MED ORDER — METOPROLOL TARTRATE 50 MG PO TABS: 50.0000 mg | ORAL_TABLET | Freq: Once | ORAL | 0 refills | Status: DC

## 2022-07-06 NOTE — Telephone Encounter (Signed)
Reaching out to patient to offer assistance regarding upcoming cardiac imaging study; pt verbalizes understanding of appt date/time, parking situation and where to check in, pre-test NPO status and medications ordered, and verified current allergies; name and call back number provided for further questions should they arise Marchia Bond RN [facility] [phone] office [phone] cell  Arrival 230 w/c entrance Difficult IV 50mg  metoprolol tartrate

## 2022-07-07 ENCOUNTER — Other Ambulatory Visit: Payer: Self-pay | Admitting: Internal Medicine

## 2022-07-07 ENCOUNTER — Ambulatory Visit (HOSPITAL_COMMUNITY)
Admission: RE | Admit: 2022-07-07 | Discharge: 2022-07-07 | Disposition: A | Discharge status: Home / Self Care | Payer: BC Managed Care – PPO | Source: Ambulatory Visit | Attending: Cardiology | Admitting: Cardiology

## 2022-07-07 DIAGNOSIS — R931 Abnormal findings on diagnostic imaging of heart and coronary circulation: Secondary | ICD-10-CM

## 2022-07-07 DIAGNOSIS — R9431 Abnormal electrocardiogram [ECG] [EKG]: Secondary | ICD-10-CM | POA: Diagnosis present

## 2022-07-07 DIAGNOSIS — I2510 Atherosclerotic heart disease of native coronary artery without angina pectoris: Secondary | ICD-10-CM

## 2022-07-07 DIAGNOSIS — R072 Precordial pain: Secondary | ICD-10-CM | POA: Diagnosis present

## 2022-07-07 MED ORDER — IOHEXOL 350 MG/ML SOLN
100.0000 mL | Freq: Once | INTRAVENOUS | Status: AC | PRN
  Administered 2022-07-07: 100 mL via INTRAVENOUS

## 2022-07-07 MED ORDER — NITROGLYCERIN 0.4 MG SL SUBL
0.8000 mg | SUBLINGUAL_TABLET | Freq: Once | SUBLINGUAL | Status: AC
  Administered 2022-07-07: 0.8 mg via SUBLINGUAL

## 2022-07-07 MED ORDER — NITROGLYCERIN 0.4 MG SL SUBL
SUBLINGUAL_TABLET | SUBLINGUAL | Status: AC
  Filled 2022-07-07: qty 2

## 2022-07-08 ENCOUNTER — Ambulatory Visit (HOSPITAL_BASED_OUTPATIENT_CLINIC_OR_DEPARTMENT_OTHER)
Admission: RE | Admit: 2022-07-08 | Discharge: 2022-07-08 | Disposition: A | Discharge status: Home / Self Care | Payer: BC Managed Care – PPO | Source: Ambulatory Visit | Attending: Internal Medicine | Admitting: Internal Medicine

## 2022-07-08 ENCOUNTER — Telehealth: Payer: Self-pay

## 2022-07-08 DIAGNOSIS — R931 Abnormal findings on diagnostic imaging of heart and coronary circulation: Secondary | ICD-10-CM | POA: Diagnosis not present

## 2022-07-08 DIAGNOSIS — I10 Essential (primary) hypertension: Secondary | ICD-10-CM

## 2022-07-08 DIAGNOSIS — Z8249 Family history of ischemic heart disease and other diseases of the circulatory system: Secondary | ICD-10-CM

## 2022-07-08 MED ORDER — ROSUVASTATIN CALCIUM 10 MG PO TABS: 10.0000 mg | ORAL_TABLET | Freq: Every day | ORAL | 3 refills | Status: DC

## 2022-07-08 NOTE — Telephone Encounter (Signed)
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 FFR negative. Otherwise calcium score is 0  Recommend starting Crestor 10mg  once a day and ASA 81mg  once a day. Check lipid panel and ALT in 2 months. Candee Furbish, MD

## 2022-07-08 NOTE — Telephone Encounter (Signed)
Patient aware of results. New orders placed.

## 2022-08-20 ENCOUNTER — Ambulatory Visit: Payer: BC Managed Care – PPO | Admitting: Cardiology

## 2022-09-07 ENCOUNTER — Other Ambulatory Visit: Payer: BC Managed Care – PPO

## 2022-10-26 ENCOUNTER — Ambulatory Visit: Payer: BC Managed Care – PPO | Attending: Cardiology | Admitting: Cardiology

## 2022-10-26 ENCOUNTER — Encounter: Payer: Self-pay | Admitting: Cardiology

## 2022-10-26 VITALS — BP 110/82 | HR 95 | Ht 71.0 in | Wt 208.6 lb

## 2022-10-26 DIAGNOSIS — Z8249 Family history of ischemic heart disease and other diseases of the circulatory system: Secondary | ICD-10-CM

## 2022-10-26 DIAGNOSIS — I10 Essential (primary) hypertension: Secondary | ICD-10-CM

## 2022-10-26 NOTE — Patient Instructions (Signed)
Medication Instructions:  The current medical regimen is effective;  continue present plan and medications.  *If you need a refill on your cardiac medications before your next appointment, please call your pharmacy*  Follow-Up: At [facility], you and your health needs are our priority.  As part of our continuing mission to provide you with exceptional heart care, we have created designated Provider Care Teams.  These Care Teams include your primary Cardiologist (physician) and Advanced Practice Providers (APPs -  Physician Assistants and Nurse Practitioners) who all work together to provide you with the care you need, when you need it.  We recommend signing up for the patient portal called "MyChart".  Sign up information is provided on this After Visit Summary.  MyChart is used to connect with patients for Virtual Visits (Telemedicine).  Patients are able to view lab/test results, encounter notes, upcoming appointments, etc.  Non-urgent messages can be sent to your provider as well.   To learn more about what you can do with MyChart, go to [email or web address].    Your next appointment:   1 year(s)  Provider:   Mark Skains, MD      

## 2022-10-26 NOTE — Progress Notes (Signed)
Cardiology Office Note:    Date:  10/26/2022   ID:  ZYIERE ROSEMOND, DOB [birth year], MRN [id number]  PCP:  Renford Dills, MD  [facility] Cardiologist:  Donato Schultz, MD  [facility] Electrophysiologist:  None   Referring MD: Renford Dills, MD    History of Present Illness:    Casey [name] is a 52 y.o. male here for cardiac prevention.  Prior note:  He was seen at the ED 04/30/22 with concerns for chest pain. EKG revealed normal sinus rhythm.  Serial troponin ordered in reviewed and are within normal limits.   Wells score for PE is 3 points making moderate risk. Dimer within normal limits.  His father had CABG.  Previously here for the evaluation of shortness of breath and abnormal EKG at the request of Angus Seller, NP.  Recent Covid pneumonia.  Was in the emergency department on 05/26/2020.  Diagnosed with Covid.  Treated with monoclonal antibody infusion while in the emergency department.  EKG showed sinus rhythm with left atrial enlargement and ST elevation suggestive of acute pericarditis.  His troponins were normal x2.  Shortness of breath with climbing stairs.  Cough.  He is here today to evaluate overall cardiac function.  Repeat EKG during an office visit on 06/11/2020 personally reviewed and interpreted shows diffuse J-point elevation especially in leads V4, V5, V6.  No real change from prior EKG on 05/26/2020.   HR personal. 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  Had to postpone this because of Covid.  Prior syncopal episode when he had COVID.   At the last visit, when coughing had some discomfort. Was eating right. Used incentive spirometer. His appetite was improving and had lost weight. Was struggling to get to the mailbox and back.  Slowly getting better.  Today, he describes the chest pain that sent him to the ED as occurring suddenly during the night when he was trying to sleep. He has not noticed chest pain since then.   He  is not a smoker.  His father had heart problems and a CABG.   Coronary CT showed 0 calcium score.  FFR was negative.  He may have had soft plaque in his LAD.  Following that, we did prescribe Crestor.  He was taking antihypertensives.  Since then he has stopped his cardiac medications and is working out, using supplements as below.  No further chest pain.   Past Medical History:  Diagnosis Date   Bronchitis    Chest pain on exertion    COVID-19    Hyperlipidemia    Obesity    Pneumonia    Respiratory failure (HCC)    SOB (shortness of breath)     Past Surgical History:  Procedure Laterality Date   APPENDECTOMY     SKIN GRAFT      Current Medications: Current Meds  Medication Sig   albuterol (VENTOLIN HFA) 108 (90 Base) MCG/ACT inhaler Inhale 2 puffs into the lungs every 6 (six) hours as needed for wheezing or shortness of breath.   ALPRAZolam (XANAX) 0.25 MG tablet Take 0.25 mg by mouth daily as needed.   Budeson-Glycopyrrol-Formoterol (BREZTRI AEROSPHERE) 160-9-4.8 MCG/ACT AERO Inhale 1 puff into the lungs daily as needed (for shortness of breath and cough).   sertraline (ZOLOFT) 25 MG tablet Take 25 mg by mouth daily.   zinc gluconate 50 MG tablet Take 50 mg by mouth daily.   [DISCONTINUED] hydrochlorothiazide (MICROZIDE) 12.5 MG capsule Take 12.5  mg by mouth daily.   [DISCONTINUED] rosuvastatin (CRESTOR) 10 MG tablet Take 1 tablet (10 mg total) by mouth daily.     Allergies:   Patient has no active allergies.   Social History   Socioeconomic History   Marital status: Married    Spouse name: Not on file   Number of children: Not on file   Years of education: Not on file   Highest education level: Not on file  Occupational History   Not on file  Tobacco Use   Smoking status: Former   Smokeless tobacco: Never  Substance and Sexual Activity   Alcohol use: No   Drug use: Never   Sexual activity: Not on file  Other Topics Concern   Not on file  Social History  Narrative   Not on file   Social Determinants of Health   Financial Resource Strain: Not on file  Food Insecurity: Not on file  Transportation Needs: Not on file  Physical Activity: Not on file  Stress: Not on file  Social Connections: Not on file     Family History: Father had CABG coronary disease.   ROS:   Please see the history of present illness.  All other systems reviewed and are negative.  EKGs/Labs/Other Studies Reviewed:    The following studies were reviewed today: Chest x-ray personally reviewed from both 8/22 and 06/11/2020-patchy infiltrates, Covid pneumonia.  07/07/22 Cor CT: 1. Moderate non-calcified stenosis of the proximal LAD, CADRADS = 3.   2. Coronary calcium score of 0. This was 0 percentile for age and sex matched control.   3. Normal coronary origin with right dominance.   4. FFR will be submitted. Normal.   Bilateral LE Venous Doppler 04/30/2022: Summary:  BILATERAL:  - No evidence of deep vein thrombosis seen in the lower extremities,  bilaterally.  -No evidence of popliteal cyst, bilaterally.  RIGHT:  - Ultrasound characteristics of enlarged lymph nodes are noted in the  groin.     LEFT:  - Ultrasound characteristics of enlarged lymph nodes noted in the groin.       Echocardiogram 07/01/2020: IMPRESSIONS   1. Global longitudinal strain is -22.5%. Left ventricular ejection  fraction, by estimation, is 55 to 60%. The left ventricle has normal  function. The left ventricle has no regional wall motion abnormalities.  Left ventricular diastolic parameters were  normal.   2. Right ventricular systolic function is normal. The right ventricular  size is normal.   3. The mitral valve is abnormal. Trivial mitral valve regurgitation.   4. The aortic valve is abnormal. Aortic valve regurgitation is trivial.  Mild aortic valve sclerosis is present, with no evidence of aortic valve  stenosis.   5. The inferior vena cava is normal in size with  greater than 50%  respiratory variability, suggesting right atrial pressure of 3 mmHg.   EKG: EKG was personally reviewed. 05/07/22: EKG was not ordered.   Recent Labs: 04/30/2022: Magnesium 2.2 06/18/2022: ALT 23; Hemoglobin 17.0; Platelets 241; TSH 2.411 06/19/2022: BUN 8; Creatinine, Ser 1.01; Potassium 3.6; Sodium 137  Recent Lipid Panel    Component Value Date/Time   CHOL 174 06/18/2022 0320   TRIG 48 06/18/2022 0320   HDL 26 (L) 06/18/2022 0320   CHOLHDL 6.7 06/18/2022 0320   VLDL 10 06/18/2022 0320   LDLCALC 138 (H) 06/18/2022 0320    Physical Exam:    VS:  BP 110/82   Pulse 95   Ht 5\' 11"  (1.803 m)   Wt  208 lb 9.6 oz (94.6 kg)   SpO2 97%   BMI 29.09 kg/m     Wt Readings from Last 3 Encounters:  10/26/22 208 lb 9.6 oz (94.6 kg)  06/23/22 200 lb 12.8 oz (91.1 kg)  06/18/22 205 lb (93 kg)     GEN:  Well nourished, well developed in no acute distress HEENT: Normal NECK: No JVD; No carotid bruits LYMPHATICS: No lymphadenopathy CARDIAC: RRR, no murmurs, rubs, gallops RESPIRATORY:  Clear to auscultation without rales, wheezing or rhonchi  ABDOMEN: Soft, non-tender, non-distended MUSCULOSKELETAL:  No edema; No deformity  SKIN: Warm and dry NEUROLOGIC:  Alert and oriented x 3 PSYCHIATRIC:  Normal affect   ASSESSMENT:    1. Family history of early CAD   2. Essential hypertension      PLAN:    In order of problems listed above: Cardiac prevention - Coronary calcium score was 0.  Soft plaque was thought to be present in his LAD, FFR was normal.  We originally had started him on Crestor however he is now using natural supplements to help with his lipids.  He also is working out, diet, smoothies etc.  He is not on any blood pressure medications at this time.  Doing well.  Hemoglobin A1c 5.4 hemoglobin 15.6 creatinine 1.0 ALT 27 LDL 124  Follow-up: We will see him back in 1 year for further prevention strategies.  Medication Adjustments/Labs and Tests  Ordered: Current medicines are reviewed at length with the patient today.  Concerns regarding medicines are outlined above.  No orders of the defined types were placed in this encounter.  No orders of the defined types were placed in this encounter.   Patient Instructions  Medication Instructions:  The current medical regimen is effective;  continue present plan and medications.  *If you need a refill on your cardiac medications before your next appointment, please call your pharmacy*  Follow-Up: At [facility], you and your health needs are our priority.  As part of our continuing mission to provide you with exceptional heart care, we have created designated Provider Care Teams.  These Care Teams include your primary Cardiologist (physician) and Advanced Practice Providers (APPs -  Physician Assistants and Nurse Practitioners) who all work together to provide you with the care you need, when you need it.  We recommend signing up for the patient portal called "MyChart".  Sign up information is provided on this After Visit Summary.  MyChart is used to connect with patients for Virtual Visits (Telemedicine).  Patients are able to view lab/test results, encounter notes, upcoming appointments, etc.  Non-urgent messages can be sent to your provider as well.   To learn more about what you can do with MyChart, go to [email or web address].    Your next appointment:   1 year(s)  Provider:   Candee Furbish, MD       Scribe utilized for medical information.  ICandee Furbish, MD, have reviewed all documentation for this visit. The documentation on 10/26/22 for the exam, diagnosis, procedures, and orders are all accurate and complete.   Signed, Candee Furbish, MD  10/26/2022 4:30 PM    [facility]

## 2023-12-13 ENCOUNTER — Ambulatory Visit
Admission: RE | Admit: 2023-12-13 | Discharge: 2023-12-13 | Disposition: A | Discharge status: Home / Self Care | Source: Ambulatory Visit | Attending: Family Medicine | Admitting: Family Medicine

## 2023-12-13 ENCOUNTER — Other Ambulatory Visit: Payer: Self-pay | Admitting: Family Medicine

## 2023-12-13 DIAGNOSIS — M7712 Lateral epicondylitis, left elbow: Secondary | ICD-10-CM
